# Patient Record
Sex: Male | Born: 1955
Health system: Southern US, Community
[De-identification: ages and names within clinical notes are randomized; demographics above are authoritative.]

## PROBLEM LIST (undated history)

## (undated) DIAGNOSIS — F419 Anxiety disorder, unspecified: Secondary | ICD-10-CM

## (undated) DIAGNOSIS — I10 Essential (primary) hypertension: Secondary | ICD-10-CM

## (undated) DIAGNOSIS — F32A Depression, unspecified: Secondary | ICD-10-CM

## (undated) DIAGNOSIS — F329 Major depressive disorder, single episode, unspecified: Secondary | ICD-10-CM

## (undated) DIAGNOSIS — N200 Calculus of kidney: Secondary | ICD-10-CM

## (undated) DIAGNOSIS — K5792 Diverticulitis of intestine, part unspecified, without perforation or abscess without bleeding: Secondary | ICD-10-CM

## (undated) HISTORY — PX: CYST REMOVAL TRUNK: SHX6283

## (undated) HISTORY — DX: Essential (primary) hypertension: I10

---

## 2002-10-05 HISTORY — PX: NECK SURGERY: SHX720

## 2003-07-08 ENCOUNTER — Emergency Department (HOSPITAL_COMMUNITY): Admission: EM | Admit: 2003-07-08 | Discharge: 2003-07-08 | Payer: Self-pay | Admitting: Emergency Medicine

## 2003-09-03 ENCOUNTER — Ambulatory Visit (HOSPITAL_COMMUNITY): Admission: RE | Admit: 2003-09-03 | Discharge: 2003-09-04 | Payer: Self-pay | Admitting: Neurosurgery

## 2004-05-02 ENCOUNTER — Ambulatory Visit (HOSPITAL_COMMUNITY): Admission: RE | Admit: 2004-05-02 | Discharge: 2004-05-02 | Payer: Self-pay | Admitting: Neurosurgery

## 2004-05-09 ENCOUNTER — Ambulatory Visit (HOSPITAL_COMMUNITY): Admission: RE | Admit: 2004-05-09 | Discharge: 2004-05-09 | Payer: Self-pay | Admitting: Neurosurgery

## 2004-10-11 ENCOUNTER — Emergency Department (HOSPITAL_COMMUNITY): Admission: EM | Admit: 2004-10-11 | Discharge: 2004-10-11 | Payer: Self-pay | Admitting: Emergency Medicine

## 2004-10-16 ENCOUNTER — Ambulatory Visit (HOSPITAL_COMMUNITY): Admission: RE | Admit: 2004-10-16 | Discharge: 2004-10-16 | Payer: Self-pay | Admitting: Urology

## 2005-06-20 ENCOUNTER — Emergency Department (HOSPITAL_COMMUNITY): Admission: EM | Admit: 2005-06-20 | Discharge: 2005-06-20 | Payer: Self-pay | Admitting: Emergency Medicine

## 2009-09-25 ENCOUNTER — Emergency Department (HOSPITAL_COMMUNITY): Admission: EM | Admit: 2009-09-25 | Discharge: 2009-09-25 | Payer: Self-pay | Admitting: Emergency Medicine

## 2011-01-05 LAB — URINE CULTURE: Colony Count: 2000

## 2011-01-05 LAB — URINALYSIS, ROUTINE W REFLEX MICROSCOPIC
Bilirubin Urine: NEGATIVE
Glucose, UA: NEGATIVE mg/dL
Ketones, ur: NEGATIVE mg/dL
Nitrite: NEGATIVE
Protein, ur: 30 mg/dL — AB
Specific Gravity, Urine: 1.023 (ref 1.005–1.030)
Urobilinogen, UA: 0.2 mg/dL (ref 0.0–1.0)
pH: 6 (ref 5.0–8.0)

## 2011-01-05 LAB — BASIC METABOLIC PANEL
BUN: 16 mg/dL (ref 6–23)
CO2: 21 mEq/L (ref 19–32)
Calcium: 9.3 mg/dL (ref 8.4–10.5)
Chloride: 107 mEq/L (ref 96–112)
Creatinine, Ser: 1.47 mg/dL (ref 0.4–1.5)
GFR calc Af Amer: >60 mL/min (ref >60)
GFR calc non Af Amer: 50 mL/min — ABNORMAL LOW (ref >60)
Glucose, Bld: 123 mg/dL — ABNORMAL HIGH (ref 70–99)
Potassium: 3.6 mEq/L (ref 3.5–5.1)
Sodium: 138 mEq/L (ref 135–145)

## 2011-01-05 LAB — URINE MICROSCOPIC-ADD ON

## 2011-02-20 NOTE — Op Note (Signed)
NAME:  Dale Hawkins, Dale Hawkins NO.:  [id number]   MEDICAL RECORD NO.:  [id number]                   PATIENT TYPE:  OIB   LOCATION:  3013                                 FACILITY:  [facility]   PHYSICIAN:  Cristi Loron, M.D.            DATE OF BIRTH:  1955-10-07   DATE OF PROCEDURE:  09/03/2003  DATE OF DISCHARGE:                                 OPERATIVE REPORT   BRIEF HISTORY:  The patient is a 55 year old white male who has suffered  from neck pain.  He failed medical management and was worked up with a  cervical MRI which demonstrated degenerative disk disease and spondylosis,  stenosis at C5-6 and C6-7.  I discussed the various treatment options with  him including surgery.  The patient weighed the risks, benefits, and  alternatives of surgery and decided to proceed with a two-level anterior  cervical diskectomy, fusion and plating.   PREOPERATIVE DIAGNOSES:  C5-6 and C6-7 degenerative disk disease,  spondylosis, stenosis, cervicalgia.   POSTOPERATIVE DIAGNOSES:  C5-6 and C6-7 degenerative disk disease,  spondylosis, stenosis, cervicalgia.   PROCEDURE:  C5-6 and C6-7 extensive anterior cervical  diskectomy/decompression; interbody iliac crest allograft arthrodesis;  anterior cervical plating C5 and C7 with Synthes titanium plate and screws.   SURGEON:  Cristi Loron, M.D.   ASSISTANT:  Danae Orleans. Venetia Maxon, M.D.   ANESTHESIA:  General endotracheal.   ESTIMATED BLOOD LOSS:  100 cc.   SPECIMENS:  None.   DRAINS:  None.   COMPLICATIONS:  None.   DESCRIPTION OF PROCEDURE:  The patient was brought to the operating room by  the anesthesia team and general endotracheal anesthesia was induced.  The  patient remained in the supine position.  A roll was placed under his  shoulders to place his neck in slight extension.  His anterior cervical  region was then prepared with Betadine scrub and Betadine solution.  Sterile  drapes were applied.  I then  injected the area to be incised with Marcaine  with epinephrine solution.  I used a scalpel to make a transverse incision  to the patient's left anterior neck by using the Metzenbaum scissors to  divide the platysma muscle and then dissect medial to his  sternocleidomastoid muscle, jugular vein and carotid artery.  I bluntly  dissected down towards the anterior cervical spine carefully identifying the  esophagus and retracting it medially with the hand-held retractors.  I then  cleared the soft tissue from the anterior cervical spine using Kitner swabs  and then inserted a bent spinal needle at the upper exposed interspace.  Obtained an intraoperative radiograph to confirm our location.   We then used electrocautery to detach the medial border of the longus colli  muscle bilaterally from the C5-6 and C6-7 intervertebral disk space.  We  inserted a Caspar self-retaining retractor for exposure and we then incised  the C5-6 intervertebral disk and performed a partial diskectomy  using the  pituitary forceps and the Carlen curets.  We inserted distractors at C5 and  C6 to distract the near space and then I used a high-speed drill to  decorticate the upper end plates at B2-8 and drill away the remainder of the  C5-6 intervertebral disk and thin out the posterior longitudinal ligament.  We also drilled away some posterior spondylosis with the drill.  We then  incised the thinned out ligament with a __________ knife and then removed it  with a crescent punch undercutting the vertebral end plates decompressing  the thecal sac and then we performed a generous foraminotomy at bilateral C6  nerve roots.   We then repeated this procedure in analogous fashion at the C6-7  decompressing the thecal sac and performing a bilateral C7 foraminotomy.   We now turned our attention to the anterior inner body arthrodesis.  We  obtained iliac crest cortical allograft bone graft and fashioned it to the   approximate dimensions 7 mm in height and 1 cm in depth.  We inserted one  bone graft in the distracted C6-7 interspace.  We removed distraction from 7  and placed it back in C5.  Distracted the C5-6 interspace and then placed a  second bone graft into the C5-6 interspace.  We removed the distraction  screws.  There was a good snug fit of the bone graft at both levels.   We now turned our attention to the anterior spinal instrumentation.  We  obtained the appropriate length Synthes titanium plate.  We drilled off some  ventral spondylosis so that the plate would lay down flat at the C5-6 and C6-  7 intervertebral disk space.  We then laid the plate along the anterior  aspect of the vertebral bodies from C5 to C7.  We drilled two holes in C5,  two at C6, and two at C7.  We then tapped the holes and secured the plate to  the vertebral body and placed two 14 mm screws at C5, 6, and 7.  We then  obtained intraoperative radiograph which demonstrated good position of the  upper plate and screws. (The lower plates and screws looked good in vivo.)  We then secured the screws to the plate by using a locking screw at each  screw.  We then obtained hemostasis with bipolar electrocautery.  We  copiously irrigated the wound with bacitracin solution and removed the  solution and then we removed the Caspar self-retaining retractor.  We then  inspected the esophagus for any damage.  There was none apparent.  We then  reapproximated the base and platysma muscle with interrupted 3-0 Vicryl  suture, subcutaneous tissue with interrupted 3-0 Vicryl suture and the skin  with Steri-Strips and Benzoin.  The wound was then covered with bacitracin  ointment and sterile dressing was applied.  The drapes were removed.  The  patient was subsequently extubated by the anesthesia team and transported to the post anesthesia care unit in stable condition.  All sponge, instrument,  and needle counts were correct at the end  of the case.                                               Cristi Loron, M.D.    JDJ/MEDQ  D:  09/03/2003  T:  09/04/2003  Job:  [id number]

## 2012-04-22 DIAGNOSIS — R519 Headache, unspecified: Secondary | ICD-10-CM | POA: Insufficient documentation

## 2015-02-06 ENCOUNTER — Other Ambulatory Visit: Payer: Self-pay | Admitting: Oncology

## 2015-03-07 ENCOUNTER — Emergency Department (HOSPITAL_COMMUNITY): Payer: 59

## 2015-03-07 ENCOUNTER — Encounter (HOSPITAL_COMMUNITY): Payer: Self-pay

## 2015-03-07 ENCOUNTER — Emergency Department (HOSPITAL_COMMUNITY)
Admission: EM | Admit: 2015-03-07 | Discharge: 2015-03-07 | Disposition: A | Discharge status: Home / Self Care | Payer: 59 | Attending: Emergency Medicine | Admitting: Emergency Medicine

## 2015-03-07 DIAGNOSIS — N201 Calculus of ureter: Secondary | ICD-10-CM | POA: Diagnosis not present

## 2015-03-07 DIAGNOSIS — R109 Unspecified abdominal pain: Secondary | ICD-10-CM | POA: Diagnosis present

## 2015-03-07 LAB — URINALYSIS, ROUTINE W REFLEX MICROSCOPIC
Bilirubin Urine: NEGATIVE
Glucose, UA: NEGATIVE mg/dL
Ketones, ur: NEGATIVE mg/dL
Leukocytes, UA: NEGATIVE
Nitrite: NEGATIVE
Protein, ur: 30 mg/dL — AB
Specific Gravity, Urine: 1.029 (ref 1.005–1.030)
Urobilinogen, UA: 0.2 mg/dL (ref 0.0–1.0)
pH: 5.5 (ref 5.0–8.0)

## 2015-03-07 LAB — BASIC METABOLIC PANEL
Anion gap: 7 (ref 5–15)
BUN: 15 mg/dL (ref 6–20)
CO2: 24 mmol/L (ref 22–32)
Calcium: 8.6 mg/dL — ABNORMAL LOW (ref 8.9–10.3)
Chloride: 104 mmol/L (ref 101–111)
Creatinine, Ser: 1.18 mg/dL (ref 0.61–1.24)
GFR calc Af Amer: >60 mL/min (ref >60)
GFR calc non Af Amer: >60 mL/min (ref >60)
Glucose, Bld: 121 mg/dL — ABNORMAL HIGH (ref 65–99)
Potassium: 3.9 mmol/L (ref 3.5–5.1)
Sodium: 135 mmol/L (ref 135–145)

## 2015-03-07 LAB — CBC
HCT: 41.9 % (ref 39.0–52.0)
Hemoglobin: 14.6 g/dL (ref 13.0–17.0)
MCH: 30.5 pg (ref 26.0–34.0)
MCHC: 34.8 g/dL (ref 30.0–36.0)
MCV: 87.5 fL (ref 78.0–100.0)
Platelets: 193 10*3/uL (ref 150–400)
RBC: 4.79 MIL/uL (ref 4.22–5.81)
RDW: 13.2 % (ref 11.5–15.5)
WBC: 10 10*3/uL (ref 4.0–10.5)

## 2015-03-07 LAB — URINE MICROSCOPIC-ADD ON

## 2015-03-07 MED ORDER — ONDANSETRON 4 MG PO TBDP: 4.0000 mg | ORAL_TABLET | Freq: Three times a day (TID) | ORAL | Status: DC | PRN

## 2015-03-07 MED ORDER — ONDANSETRON HCL 4 MG/2ML IJ SOLN
4.0000 mg | Freq: Once | INTRAMUSCULAR | Status: AC
  Administered 2015-03-07: 4 mg via INTRAVENOUS
  Filled 2015-03-07: qty 2

## 2015-03-07 MED ORDER — TAMSULOSIN HCL 0.4 MG PO CAPS: 0.4000 mg | ORAL_CAPSULE | Freq: Every day | ORAL | Status: DC

## 2015-03-07 MED ORDER — KETOROLAC TROMETHAMINE 30 MG/ML IJ SOLN
30.0000 mg | Freq: Once | INTRAMUSCULAR | Status: AC
Start: 2015-03-07 — End: 2015-03-07
  Administered 2015-03-07: 30 mg via INTRAVENOUS
  Filled 2015-03-07: qty 1

## 2015-03-07 MED ORDER — MORPHINE SULFATE 4 MG/ML IJ SOLN
4.0000 mg | Freq: Once | INTRAMUSCULAR | Status: AC
  Administered 2015-03-07: 4 mg via INTRAVENOUS
  Filled 2015-03-07: qty 1

## 2015-03-07 MED ORDER — OXYCODONE-ACETAMINOPHEN 7.5-325 MG PO TABS: 2.0000 | ORAL_TABLET | Freq: Four times a day (QID) | ORAL | Status: DC | PRN

## 2015-03-07 NOTE — ED Notes (Signed)
PA at bedside.

## 2015-03-07 NOTE — Discharge Instructions (Signed)
Please follow up with Dr. Vernie Ammonsttelin to schedule a follow up appointment. Please take pain medication and/or muscle relaxants as prescribed and as needed for pain. Please do not drive on narcotic pain medication or on muscle relaxants. Please read all discharge instructions and return precautions.   Kidney Stones Kidney stones (urolithiasis) are deposits that form inside your kidneys. The intense pain is caused by the stone moving through the urinary tract. When the stone moves, the ureter goes into spasm around the stone. The stone is usually passed in the urine.  CAUSES   A disorder that makes certain neck glands produce too much parathyroid hormone (primary hyperparathyroidism).  A buildup of uric acid crystals, similar to gout in your joints.  Narrowing (stricture) of the ureter.  A kidney obstruction present at birth (congenital obstruction).  Previous surgery on the kidney or ureters.  Numerous kidney infections. SYMPTOMS   Feeling sick to your stomach (nauseous).  Throwing up (vomiting).  Blood in the urine (hematuria).  Pain that usually spreads (radiates) to the groin.  Frequency or urgency of urination. DIAGNOSIS   Taking a history and physical exam.  Blood or urine tests.  CT scan.  Occasionally, an examination of the inside of the urinary bladder (cystoscopy) is performed. TREATMENT   Observation.  Increasing your fluid intake.  Extracorporeal shock wave lithotripsy--This is a noninvasive procedure that uses shock waves to break up kidney stones.  Surgery may be needed if you have severe pain or persistent obstruction. There are various surgical procedures. Most of the procedures are performed with the use of small instruments. Only small incisions are needed to accommodate these instruments, so recovery time is minimized. The size, location, and chemical composition are all important variables that will determine the proper choice of action for you. Talk to  your health care provider to better understand your situation so that you will minimize the risk of injury to yourself and your kidney.  HOME CARE INSTRUCTIONS   Drink enough water and fluids to keep your urine clear or pale yellow. This will help you to pass the stone or stone fragments.  Strain all urine through the provided strainer. Keep all particulate matter and stones for your health care provider to see. The stone causing the pain may be as small as a grain of salt. It is very important to use the strainer each and every time you pass your urine. The collection of your stone will allow your health care provider to analyze it and verify that a stone has actually passed. The stone analysis will often identify what you can do to reduce the incidence of recurrences.  Only take over-the-counter or prescription medicines for pain, discomfort, or fever as directed by your health care provider.  Make a follow-up appointment with your health care provider as directed.  Get follow-up X-rays if required. The absence of pain does not always mean that the stone has passed. It may have only stopped moving. If the urine remains completely obstructed, it can cause loss of kidney function or even complete destruction of the kidney. It is your responsibility to make sure X-rays and follow-ups are completed. Ultrasounds of the kidney can show blockages and the status of the kidney. Ultrasounds are not associated with any radiation and can be performed easily in a matter of minutes. SEEK MEDICAL CARE IF:  You experience pain that is progressive and unresponsive to any pain medicine you have been prescribed. SEEK IMMEDIATE MEDICAL CARE IF:   Pain cannot  be controlled with the prescribed medicine.  You have a fever or shaking chills.  The severity or intensity of pain increases over 18 hours and is not relieved by pain medicine.  You develop a new onset of abdominal pain.  You feel faint or pass  out.  You are unable to urinate. MAKE SURE YOU:   Understand these instructions.  Will watch your condition.  Will get help right away if you are not doing well or get worse. Document Released: 09/21/2005 Document Revised: 05/24/2013 Document Reviewed: 02/22/2013 [facility] Patient Information 2015 [place], [place]. This information is not intended to replace advice given to you by your health care provider. Make sure you discuss any questions you have with your health care provider.

## 2015-03-07 NOTE — ED Provider Notes (Signed)
CSN: [id number]     Arrival date & time 03/07/15  0048 History   First MD Initiated Contact with Patient 03/07/15 0118     Chief Complaint  Patient presents with  . Flank Pain     (Consider location/radiation/quality/duration/timing/severity/associated sxs/prior Treatment) HPI Comments: Patient is a 59 year old male past medical history significant for nephrolithiasis presenting to the emergency department for acute onset left flank pain that is now radiating to his left lower abdomen. He states his pain began around 9 PM last evening. Describes it as waxing and waning in nature. Endorses 3 episodes of nonbloody nonbilious emesis with increased pain. He tried his at home oxycodone without relief. No modifying factors identified. He does endorse a history of lithotripsy, several years ago. He is followed by Dr. Vernie Ammons at [facility]. No abdominal surgical history.  Patient is a 59 y.o. male presenting with flank pain.  Flank Pain Associated symptoms include abdominal pain, nausea and vomiting.    History reviewed. No pertinent past medical history. History reviewed. No pertinent past surgical history. History reviewed. No pertinent family history. History  Substance Use Topics  . Smoking status: Never Smoker   . Smokeless tobacco: Not on file  . Alcohol Use: No    Review of Systems  Gastrointestinal: Positive for nausea, vomiting and abdominal pain. Negative for diarrhea and constipation.  Genitourinary: Positive for flank pain.  All other systems reviewed and are negative.     Allergies  Review of patient's allergies indicates no known allergies.  Home Medications   Prior to Admission medications   Medication Sig Start Date End Date Taking? Authorizing Provider  ibuprofen (ADVIL,MOTRIN) 200 MG tablet Take 400 mg by mouth every 6 (six) hours as needed for mild pain.   Yes Historical Provider, MD  Misc Natural Products (TART CHERRY ADVANCED) CAPS Take 2 capsules by mouth  at bedtime.   Yes Historical Provider, MD  PRESCRIPTION MEDICATION Apply 1 application topically daily.   Yes Historical Provider, MD  ondansetron (ZOFRAN ODT) 4 MG disintegrating tablet Take 1 tablet (4 mg total) by mouth every 8 (eight) hours as needed for nausea. 03/07/15   Kerah Hardebeck, PA-C  oxyCODONE-acetaminophen (PERCOCET) 7.5-325 MG per tablet Take 2 tablets by mouth every 6 (six) hours as needed for severe pain. 03/07/15   Zoey Bidwell, PA-C  tamsulosin (FLOMAX) 0.4 MG CAPS capsule Take 1 capsule (0.4 mg total) by mouth daily. 03/07/15   Hermenegildo Clausen, PA-C   BP 130/86 mmHg  Pulse 57  Temp(Src) 98 F (36.7 C) (Oral)  Resp 18  Ht  (1.803 m)  Wt 185 lb (83.915 kg)  BMI 25.81 kg/m2  SpO2 98% Physical Exam  Constitutional: He is oriented to person, place, and time. He appears well-developed and well-nourished. No distress.  HENT:  Head: Normocephalic and atraumatic.  Right Ear: External ear normal.  Left Ear: External ear normal.  Nose: Nose normal.  Eyes: Conjunctivae are normal.  Neck: Neck supple.  Cardiovascular: Normal rate, regular rhythm and normal heart sounds.   Pulmonary/Chest: Effort normal and breath sounds normal.  Abdominal: Soft. Bowel sounds are normal. He exhibits no distension. There is tenderness. There is no rigidity, no rebound, no guarding and no CVA tenderness.  Neurological: He is alert and oriented to person, place, and time.  Skin: Skin is warm and dry. He is not diaphoretic.  Nursing note and vitals reviewed.   ED Course  Procedures (including critical care time) Medications  morphine 4 MG/ML injection 4 mg (  4 mg Intravenous Given 03/07/15 0221)  ondansetron (ZOFRAN) injection 4 mg (4 mg Intravenous Given 03/07/15 0220)  ketorolac (TORADOL) 30 MG/ML injection 30 mg (30 mg Intravenous Given 03/07/15 0406)    Labs Review Labs Reviewed  BASIC METABOLIC PANEL - Abnormal; Notable for the following:    Glucose, Bld 121 (*)     Calcium 8.6 (*)    All other components within normal limits  URINALYSIS, ROUTINE W REFLEX MICROSCOPIC (NOT AT [facility]) - Abnormal; Notable for the following:    Color, Urine AMBER (*)    APPearance CLOUDY (*)    Hgb urine dipstick LARGE (*)    Protein, ur 30 (*)    All other components within normal limits  URINE MICROSCOPIC-ADD ON - Abnormal; Notable for the following:    Crystals CA OXALATE CRYSTALS (*)    All other components within normal limits  CBC    Imaging Review Ct Renal Stone Study  03/07/2015   CLINICAL DATA:  LEFT flank pain and hematuria beginning at 2100 hours yesterday, not improved with prescription pain medication. History of kidney stones.  EXAM: CT ABDOMEN AND PELVIS WITHOUT CONTRAST  TECHNIQUE: Multidetector CT imaging of the abdomen and pelvis was performed following the standard protocol without IV contrast.  COMPARISON:  Abdominal radiograph September 25, 2009 and CT abdomen and pelvis June 20, 2005  FINDINGS: LUNG BASES: Included view of the lung bases demonstrate dependent atelectasis. The visualized heart and pericardium are unremarkable. Mild pectus excavatum.  KIDNEYS/BLADDER: Kidneys are orthotopic, demonstrating normal size and morphology. At least 8 RIGHT and 3 LEFT nephrolithiasis measuring up to 4 mm on the RIGHT, 4 mm on the LEFT. Mild LEFT hydroureteronephrosis to the level of the distal ureter where a 3 mm calculus is seen. In addition, 4 mm calculus at RIGHT ureterovesicular junction. No hydronephrosis; limited assessment for renal masses on this nonenhanced examination. The unopacified ureters are normal in course and caliber. Urinary bladder is partially distended and unremarkable.  SOLID ORGANS: The liver, spleen, gallbladder, pancreas and adrenal glands are unremarkable for this non-contrast examination.  GASTROINTESTINAL TRACT: Small hiatal hernia. The stomach, small and large bowel are normal in course and caliber without inflammatory changes, the  sensitivity may be decreased by lack of enteric contrast. Moderate colonic diverticulosis. Superimposed focal inflammation at the level the sigmoid colon consistent with acute diverticulitis, axial 76/87. Normal appendix.  PERITONEUM/RETROPERITONEUM: Aortoiliac vessels are normal in course and caliber. No lymphadenopathy by CT size criteria. Mild prostatomegaly. No intraperitoneal free fluid nor free air.  SOFT TISSUES/ OSSEOUS STRUCTURES: Nonsuspicious. Small fat containing inguinal hernias.  IMPRESSION: Mild LEFT hydroureteronephrosis to the level of the distal ureter where a 3 mm calculus is seen. In addition, nonobstructing 4 mm RIGHT ureteral vesicular junction calculus.  Mild uncomplicated acute sigmoid diverticulitis.  Bilateral nephrolithiasis measuring up to 4 mm.   Electronically Signed   By: Awilda Metro M.D.   On: 03/07/2015 03:12     EKG Interpretation None      MDM   Final diagnoses:  Flank pain  Right ureteral stone  Left ureteral stone    Filed Vitals:   03/07/15 0403  BP: 130/86  Pulse: 57  Temp: 98 F (36.7 C)  Resp: 18   Afebrile, NAD, non-toxic appearing, AAOx4.  I have reviewed nursing notes, vital signs, and all appropriate lab and imaging results if ordered as above.  Pt has been diagnosed with a Kidney Stone via CT. There is no evidence of significant hydronephrosis, serum  creatine WNL, vitals sign stable and the pt does not have irratractable vomiting. Pt will be dc home with pain medications & has been advised to follow up with Dr. Vernie Ammonsttelin his urologist. Return precautions discussed. Patient is agreeable to plan. Patient is stable at time of discharge     Francee PiccoloJennifer Divit Stipp, PA-C 03/07/15 0447  Layla MawKristen N Ward, DO 03/07/15 [id number]

## 2015-03-07 NOTE — ED Notes (Signed)
Patient followed by Dr Irene Papttolin [facility]), hx of kidney stones. Patient states his pain started at 2100, took 2 Oxycodone without relief. Patient states he is having left flank pain, similar to his kidney stone pain in the past.

## 2015-03-07 NOTE — ED Notes (Signed)
Pt complains of left flank pain since 9pm, hx of kidney stones and the pain is the same, pt has vomited two times tonight

## 2015-03-07 NOTE — ED Notes (Signed)
Family at bedside. 

## 2015-05-10 ENCOUNTER — Other Ambulatory Visit: Payer: Self-pay | Admitting: *Deleted

## 2015-05-15 ENCOUNTER — Encounter (HOSPITAL_COMMUNITY): Payer: Self-pay | Admitting: Emergency Medicine

## 2015-05-15 ENCOUNTER — Emergency Department (HOSPITAL_COMMUNITY)
Admission: EM | Admit: 2015-05-15 | Discharge: 2015-05-15 | Disposition: A | Discharge status: Home / Self Care | Payer: 59 | Attending: Emergency Medicine | Admitting: Emergency Medicine

## 2015-05-15 DIAGNOSIS — N200 Calculus of kidney: Secondary | ICD-10-CM | POA: Insufficient documentation

## 2015-05-15 DIAGNOSIS — R109 Unspecified abdominal pain: Secondary | ICD-10-CM | POA: Diagnosis present

## 2015-05-15 DIAGNOSIS — Z9889 Other specified postprocedural states: Secondary | ICD-10-CM | POA: Insufficient documentation

## 2015-05-15 DIAGNOSIS — Z79899 Other long term (current) drug therapy: Secondary | ICD-10-CM | POA: Insufficient documentation

## 2015-05-15 LAB — CBC
HCT: 43.7 % (ref 39.0–52.0)
HEMOGLOBIN: 15.1 g/dL (ref 13.0–17.0)
MCH: 30.1 pg (ref 26.0–34.0)
MCHC: 34.6 g/dL (ref 30.0–36.0)
MCV: 87.2 fL (ref 78.0–100.0)
PLATELETS: 226 10*3/uL (ref 150–400)
RBC: 5.01 MIL/uL (ref 4.22–5.81)
RDW: 13.4 % (ref 11.5–15.5)
WBC: 7.3 10*3/uL (ref 4.0–10.5)

## 2015-05-15 LAB — BASIC METABOLIC PANEL
Anion gap: 10 (ref 5–15)
BUN: 17 mg/dL (ref 6–20)
CALCIUM: 9.3 mg/dL (ref 8.9–10.3)
CO2: 26 mmol/L (ref 22–32)
CREATININE: 1.33 mg/dL — AB (ref 0.61–1.24)
Chloride: 103 mmol/L (ref 101–111)
GFR calc Af Amer: >60 mL/min (ref >60)
GFR, EST NON AFRICAN AMERICAN: 57 mL/min — AB (ref >60)
GLUCOSE: 113 mg/dL — AB (ref 65–99)
Potassium: 4.2 mmol/L (ref 3.5–5.1)
Sodium: 139 mmol/L (ref 135–145)

## 2015-05-15 LAB — URINALYSIS, ROUTINE W REFLEX MICROSCOPIC
BILIRUBIN URINE: NEGATIVE
Glucose, UA: NEGATIVE mg/dL
KETONES UR: NEGATIVE mg/dL
LEUKOCYTES UA: NEGATIVE
NITRITE: NEGATIVE
PROTEIN: NEGATIVE mg/dL
Specific Gravity, Urine: 1.017 (ref 1.005–1.030)
UROBILINOGEN UA: 0.2 mg/dL (ref 0.0–1.0)
pH: 5.5 (ref 5.0–8.0)

## 2015-05-15 LAB — URINE MICROSCOPIC-ADD ON

## 2015-05-15 MED ORDER — ONDANSETRON HCL 4 MG PO TABS: 4.0000 mg | ORAL_TABLET | Freq: Four times a day (QID) | ORAL | Status: DC

## 2015-05-15 MED ORDER — ONDANSETRON HCL 4 MG/2ML IJ SOLN
4.0000 mg | Freq: Once | INTRAMUSCULAR | Status: AC
  Administered 2015-05-15: 4 mg via INTRAVENOUS
  Filled 2015-05-15: qty 2

## 2015-05-15 MED ORDER — SODIUM CHLORIDE 0.9 % IV BOLUS (SEPSIS)
1000.0000 mL | Freq: Once | INTRAVENOUS | Status: AC
  Administered 2015-05-15: 1000 mL via INTRAVENOUS

## 2015-05-15 MED ORDER — TAMSULOSIN HCL 0.4 MG PO CAPS: 0.4000 mg | ORAL_CAPSULE | Freq: Every day | ORAL | Status: DC

## 2015-05-15 MED ORDER — IBUPROFEN 600 MG PO TABS: 600.0000 mg | ORAL_TABLET | Freq: Four times a day (QID) | ORAL | Status: DC | PRN

## 2015-05-15 MED ORDER — ACETAMINOPHEN 325 MG PO TABS
650.0000 mg | ORAL_TABLET | Freq: Once | ORAL | Status: DC
  Filled 2015-05-15: qty 2

## 2015-05-15 MED ORDER — KETOROLAC TROMETHAMINE 30 MG/ML IJ SOLN
30.0000 mg | Freq: Once | INTRAMUSCULAR | Status: DC
  Filled 2015-05-15: qty 1

## 2015-05-15 MED ORDER — OXYCODONE-ACETAMINOPHEN 5-325 MG PO TABS: 1.0000 | ORAL_TABLET | ORAL | Status: DC | PRN

## 2015-05-15 NOTE — Discharge Instructions (Signed)
Return without fail for worsening symptoms, including vomiting unable to keep down food or fluids, worsening pain, fevers, pain or burning with urination, or any other symptoms concerning to you. Please call urologist for follow-up within 1 week.   Kidney Stones Kidney stones (urolithiasis) are solid masses that form inside your kidneys. The intense pain is caused by the stone moving through the kidney, ureter, bladder, and urethra (urinary tract). When the stone moves, the ureter starts to spasm around the stone. The stone is usually passed in your pee (urine).  HOME CARE  Drink enough fluids to keep your pee clear or pale yellow. This helps to get the stone out.  Strain all pee through the provided strainer. Do not pee without peeing through the strainer, not even once. If you pee the stone out, catch it in the strainer. The stone may be as small as a grain of salt. Take this to your doctor. This will help your doctor figure out what you can do to try to prevent more kidney stones.  Only take medicine as told by your doctor.  Follow up with your doctor as told.  Get follow-up X-rays as told by your doctor. GET HELP IF: You have pain that gets worse even if you have been taking pain medicine. GET HELP RIGHT AWAY IF:   Your pain does not get better with medicine.  You have a fever or shaking chills.  Your pain increases and gets worse over 18 hours.  You have new belly (abdominal) pain.  You feel faint or pass out.  You are unable to pee. MAKE SURE YOU:   Understand these instructions.  Will watch your condition.  Will get help right away if you are not doing well or get worse. Document Released: 03/09/2008 Document Revised: 05/24/2013 Document Reviewed: 02/22/2013 [facility] Patient Information 2015 [place], [place]. This information is not intended to replace advice given to you by your health care provider. Make sure you discuss any questions you have with your health care  provider.

## 2015-05-15 NOTE — ED Notes (Signed)
Pt states yesterday began having rt sided flank pain radiating into groin area beginning yesterday. States it eased up last night, but has worsened into today. Attempted to get into PCP office today. States pain has become unbearable. Hx of kidney stones, states it feels like a kidney stone. Denies blood in urine/difficulty urinating, N/V/D, fever.

## 2015-05-15 NOTE — ED Provider Notes (Signed)
CSN: [id number]     Arrival date & time 05/15/15  1208 History   First MD Initiated Contact with Patient 05/15/15 1238     Chief Complaint  Patient presents with  . Flank Pain     (Consider location/radiation/quality/duration/timing/severity/associated sxs/prior Treatment) HPI 59 year old male who presents with right flank pain. He has a history of nephrolithiasis. Reports onset of right upper quadrant pain yesterday, that is now radiating into his right flank. Pain has been intermittent and improved with home analgesics. Reports that pain is very similar to prior history of kidney stones. Denies any associated dysuria, hematuria, urinary frequency, nausea, vomiting, diarrhea, melena, or hematochezia. He denies that pain is postprandial in nature. He has been straining his urine at home, and states that he has not been able to pass his kidney stone yet. He had a severe episode of pain just prior to arrival which she took oxycodone. He subsequently drove himself to the ED from work for evaluation. On arrival, patient had fully subsided. He reports mild nausea.    History reviewed. No pertinent past medical history. Past Surgical History  Procedure Laterality Date  . Cyst removal trunk     History reviewed. No pertinent family history. Social History  Substance Use Topics  . Smoking status: Never Smoker   . Smokeless tobacco: None  . Alcohol Use: No    Review of Systems 10/14 systems reviewed and are negative other than those stated in the HPI  Allergies  Review of patient's allergies indicates no known allergies.  Home Medications   Prior to Admission medications   Medication Sig Start Date End Date Taking? Authorizing Provider  ibuprofen (ADVIL,MOTRIN) 200 MG tablet Take 400 mg by mouth every 6 (six) hours as needed for mild pain.   Yes Historical Provider, MD  oxyCODONE-acetaminophen (PERCOCET) 7.5-325 MG per tablet Take 2 tablets by mouth every 6 (six) hours as needed for  severe pain. 03/07/15  Yes Jennifer Piepenbrink, PA-C  PRESCRIPTION MEDICATION Apply 1 application topically daily.   Yes Historical Provider, MD  tamsulosin (FLOMAX) 0.4 MG CAPS capsule Take 1 capsule (0.4 mg total) by mouth daily. 03/07/15  Yes Jennifer Piepenbrink, PA-C  ibuprofen (ADVIL,MOTRIN) 600 MG tablet Take 1 tablet (600 mg total) by mouth every 6 (six) hours as needed for moderate pain. 05/15/15   Lavera Guise, MD  ondansetron (ZOFRAN ODT) 4 MG disintegrating tablet Take 1 tablet (4 mg total) by mouth every 8 (eight) hours as needed for nausea. Patient not taking: Reported on 05/15/2015 03/07/15   Francee Piccolo, PA-C  ondansetron (ZOFRAN) 4 MG tablet Take 1 tablet (4 mg total) by mouth every 6 (six) hours. 05/15/15   Lavera Guise, MD  oxyCODONE-acetaminophen (PERCOCET/ROXICET) 5-325 MG per tablet Take 1-2 tablets by mouth every 4 (four) hours as needed for severe pain. 05/15/15   Lavera Guise, MD  tamsulosin (FLOMAX) 0.4 MG CAPS capsule Take 1 capsule (0.4 mg total) by mouth daily. 05/15/15   Lavera Guise, MD   BP 139/89 mmHg  Pulse 61  Temp(Src) 97.8 F (36.6 C) (Oral)  Resp 20  SpO2 97% Physical Exam Physical Exam  Nursing note and vitals reviewed. Constitutional: Well developed, well nourished, non-toxic, and in no acute distress Head: Normocephalic and atraumatic.  Mouth/Throat: Oropharynx is clear and moist.  Neck: Normal range of motion. Neck supple.  Cardiovascular: Normal rate and regular rhythm.   no lower extremity edema. Pulmonary/Chest: Effort normal and breath sounds normal.  Abdominal: Soft. There is  no tenderness. There is no rebound and no guarding.  no CVA tenderness.  Musculoskeletal: Normal range of motion.  Neurological: Alert, no facial droop, fluent speech, moves all extremities symmetrically Skin: Skin is warm and dry.  Psychiatric: Cooperative  ED Course  Procedures (including critical care time) Labs Review Labs Reviewed  URINALYSIS, ROUTINE W REFLEX  MICROSCOPIC (NOT AT [facility]) - Abnormal; Notable for the following:    Hgb urine dipstick MODERATE (*)    All other components within normal limits  BASIC METABOLIC PANEL - Abnormal; Notable for the following:    Glucose, Bld 113 (*)    Creatinine, Ser 1.33 (*)    GFR calc non Af Amer 57 (*)    All other components within normal limits  CBC  URINE MICROSCOPIC-ADD ON    Imaging Review No results found.   EKG Interpretation None      MDM   Final diagnoses:  Kidney stone   In short, this is a 59 year old male with history of nephrolithiasis who presents with right flank pain consistent with prior episodes of urolithiasis. He is nontoxic in no acute distress on presentation. His vital signs are within normal limits. He is a soft and benign abdomen without any abdominal pain or significant CVA tenderness. He did have one episode of vomiting while here in the emergency department. Basic blood work reveals normal CBC and BMP with normal renal function. During ED course, patient did have episode of nausea and vomiting. He is given IV fluids as well as Zofran for symptomatic control. UA shows evidence of moderate amount of blood, that seems consistent with history of nephrolithiasis. Given that these are the exact same symptoms he has had with previous history of kidney stones, do not think that he requires any additional imaging at this time to confirm. He does not have any major risk factors for other serious or toxic etiologies of his symptoms such as aortic pathology or bleeding. He has previous CT scan 1 month ago, with no evidence of aortic aneurysm. He is able to tolerate oral intake in the emergency department without any issues. He is given urology follow-up as an outpatient. Strict return instructions were also reviewed with this patient as well as symptoms that would be suggestive of infected kidney stone or a stone that is unlikely to pass. He expressed understanding of all discharge  instructions for comfortable to plan of care.  Lavera Guise, MD 05/15/15 [phone]

## 2015-09-09 ENCOUNTER — Telehealth: Payer: Self-pay | Admitting: *Deleted

## 2015-09-09 NOTE — Telephone Encounter (Signed)
Called patient to inform him a prescription has been sent to pharmacy for him to take prior to chemo treatment.

## 2016-07-01 ENCOUNTER — Emergency Department (HOSPITAL_COMMUNITY)
Admission: EM | Admit: 2016-07-01 | Discharge: 2016-07-01 | Disposition: A | Discharge status: Home / Self Care | Payer: 59 | Attending: Emergency Medicine | Admitting: Emergency Medicine

## 2016-07-01 ENCOUNTER — Encounter (HOSPITAL_COMMUNITY): Payer: Self-pay

## 2016-07-01 DIAGNOSIS — Y929 Unspecified place or not applicable: Secondary | ICD-10-CM | POA: Insufficient documentation

## 2016-07-01 DIAGNOSIS — Z79899 Other long term (current) drug therapy: Secondary | ICD-10-CM | POA: Diagnosis not present

## 2016-07-01 DIAGNOSIS — W268XXA Contact with other sharp object(s), not elsewhere classified, initial encounter: Secondary | ICD-10-CM | POA: Diagnosis not present

## 2016-07-01 DIAGNOSIS — S0990XA Unspecified injury of head, initial encounter: Secondary | ICD-10-CM | POA: Diagnosis present

## 2016-07-01 DIAGNOSIS — Y939 Activity, unspecified: Secondary | ICD-10-CM | POA: Diagnosis not present

## 2016-07-01 DIAGNOSIS — S0101XA Laceration without foreign body of scalp, initial encounter: Secondary | ICD-10-CM | POA: Diagnosis not present

## 2016-07-01 DIAGNOSIS — Z791 Long term (current) use of non-steroidal anti-inflammatories (NSAID): Secondary | ICD-10-CM | POA: Diagnosis not present

## 2016-07-01 DIAGNOSIS — Y999 Unspecified external cause status: Secondary | ICD-10-CM | POA: Diagnosis not present

## 2016-07-01 NOTE — Discharge Instructions (Signed)
Keep wound clean with your normal shampoo and water. Keep area covered with a topical antibiotic ointment and bandage (if possible), keep bandage dry, and do not submerge in water for 24 hours. Ice area for additional pain relief and swelling, using an ice pack for no more than 20 minutes per hour of time that passes. Alternate between Ibuprofen and Tylenol for additional pain relief. Follow up with your primary care doctor or the [facility] in approximately 7-10 days for wound recheck and staple removal. Monitor area for signs of infection to include, but not limited to: increasing pain, spreading redness, drainage/pus, worsening swelling, or fevers. Return to emergency department for emergent changing or worsening symptoms.

## 2016-07-01 NOTE — ED Provider Notes (Signed)
[facility]-EMERGENCY DEPT Provider Note   CSN: [id number] Arrival date & time: 07/01/16  2118     History   Chief Complaint Chief Complaint  Patient presents with  . Head Laceration    HPI Dale Hawkins is a 60 y.o. male who presents to the ED with complaints of scalp laceration sustained at 7:15 PM when the tree branch he was cutting down hit him on the scalp/forehead region. Bleeding was controlled fairly quickly and he has not had any further ongoing bleeding, denies blood thinner use or bleeding disorders. Last tetanus was 5 years ago. He denies any pain, headache, vision changes, LOC, chest pain, shortness breath, abdominal pain, nausea, vomiting, neck or back pain, numbness, tingling, focal weakness, or any other complaints or injuries at this time.   The history is provided by the patient and medical records. No language interpreter was used.  Head Laceration  This is a new problem. The current episode started 3 to 5 hours ago. The problem occurs rarely. The problem has not changed since onset.Pertinent negatives include no chest pain, no abdominal pain, no headaches and no shortness of breath. Nothing aggravates the symptoms. Nothing relieves the symptoms. He has tried nothing for the symptoms.    History reviewed. No pertinent past medical history.  There are no active problems to display for this patient.   Past Surgical History:  Procedure Laterality Date  . CYST REMOVAL TRUNK         Home Medications    Prior to Admission medications   Medication Sig Start Date End Date Taking? Authorizing Provider  ibuprofen (ADVIL,MOTRIN) 200 MG tablet Take 400 mg by mouth every 6 (six) hours as needed for mild pain.    Historical Provider, MD  ibuprofen (ADVIL,MOTRIN) 600 MG tablet Take 1 tablet (600 mg total) by mouth every 6 (six) hours as needed for moderate pain. 05/15/15   Lavera Guise, MD  ondansetron (ZOFRAN ODT) 4 MG disintegrating tablet Take 1 tablet (4 mg total) by  mouth every 8 (eight) hours as needed for nausea. Patient not taking: Reported on 05/15/2015 03/07/15   Francee Piccolo, PA-C  ondansetron (ZOFRAN) 4 MG tablet Take 1 tablet (4 mg total) by mouth every 6 (six) hours. 05/15/15   Lavera Guise, MD  oxyCODONE-acetaminophen (PERCOCET) 7.5-325 MG per tablet Take 2 tablets by mouth every 6 (six) hours as needed for severe pain. 03/07/15   Francee Piccolo, PA-C  oxyCODONE-acetaminophen (PERCOCET/ROXICET) 5-325 MG per tablet Take 1-2 tablets by mouth every 4 (four) hours as needed for severe pain. 05/15/15   Lavera Guise, MD  PRESCRIPTION MEDICATION Apply 1 application topically daily.    Historical Provider, MD  tamsulosin (FLOMAX) 0.4 MG CAPS capsule Take 1 capsule (0.4 mg total) by mouth daily. 03/07/15   Jennifer Piepenbrink, PA-C  tamsulosin (FLOMAX) 0.4 MG CAPS capsule Take 1 capsule (0.4 mg total) by mouth daily. 05/15/15   Lavera Guise, MD    Family History History reviewed. No pertinent family history.  Social History Social History  Substance Use Topics  . Smoking status: Never Smoker  . Smokeless tobacco: Never Used  . Alcohol use No     Allergies   Review of patient's allergies indicates no known allergies.   Review of Systems Review of Systems  Eyes: Negative for visual disturbance.  Respiratory: Negative for shortness of breath.   Cardiovascular: Negative for chest pain.  Gastrointestinal: Negative for abdominal pain, nausea and vomiting.  Musculoskeletal: Negative for back pain  and neck pain.  Skin: Positive for wound.  Allergic/Immunologic: Negative for immunocompromised state.  Neurological: Negative for syncope, weakness, light-headedness, numbness and headaches.  Hematological: Does not bruise/bleed easily.  Psychiatric/Behavioral: Negative for confusion.   10 Systems reviewed and are negative for acute change except as noted in the HPI.   Physical Exam Updated Vital Signs BP 133/98 (BP Location: Left Arm)    Pulse 69   Temp 98.3 F (36.8 C) (Oral)   Resp 15   Ht 5\' 10"  (1.778 m)   Wt 83.9 kg   SpO2 99%   BMI 26.54 kg/m   Physical Exam  Constitutional: He is oriented to person, place, and time. Vital signs are normal. He appears well-developed and well-nourished.  Non-toxic appearance. No distress.  Afebrile, nontoxic, NAD  HENT:  Head: Normocephalic. Head is with laceration.    Mouth/Throat: Oropharynx is clear and moist and mucous membranes are normal.  ~3cm linear superficial laceration to frontal scalp near the hairline, no ongoing bleeding, no retained FBs noted, no focal bony TTP or scalp crepitus/deformity, no bruising or swelling. SEE PICTURE BELOW  Eyes: Conjunctivae and EOM are normal. Pupils are equal, round, and reactive to light. Right eye exhibits no discharge. Left eye exhibits no discharge.  PERRL, EOMI, no nystagmus, no visual field deficits   Neck: Normal range of motion. Neck supple. No spinous process tenderness and no muscular tenderness present. No neck rigidity. Normal range of motion present.  Cardiovascular: Normal rate and intact distal pulses.   Pulmonary/Chest: Effort normal. No respiratory distress.  Abdominal: Normal appearance. He exhibits no distension.  Musculoskeletal: Normal range of motion.  MAE x4 Strength and sensation grossly intact Distal pulses intact Gait steady  Neurological: He is alert and oriented to person, place, and time. He has normal strength. No cranial nerve deficit or sensory deficit. Coordination and gait normal. GCS eye subscore is 4. GCS verbal subscore is 5. GCS motor subscore is 6.  CN 2-12 grossly intact A&O x4 GCS 15 Sensation and strength intact Gait nonataxic Coordination with finger-to-nose WNL  Skin: Skin is warm and dry. Laceration noted. No rash noted.  Scalp lac as noted above and pictured below  Psychiatric: He has a normal mood and affect.  Nursing note and vitals reviewed.      ED Treatments / Results    Labs (all labs ordered are listed, but only abnormal results are displayed) Labs Reviewed - No data to display  EKG  EKG Interpretation None       Radiology No results found.  Procedures .Marland Kitchen.Laceration Repair Date/Time: 07/01/2016 10:30 PM Performed by: Allen DerryAMPRUBI-SOMS, Kalisha Keadle Authorized by: Allen DerryAMPRUBI-SOMS, Tea Collums   Consent:    Consent obtained:  Verbal   Consent given by:  Patient   Risks discussed:  Pain and infection   Alternatives discussed:  Delayed treatment, no treatment and observation Anesthesia (see MAR for exact dosages):    Anesthesia method:  None Laceration details:    Location:  Scalp   Scalp location:  Frontal   Length (cm):  3 Repair type:    Repair type:  Simple Pre-procedure details:    Preparation:  Patient was prepped and draped in usual sterile fashion Exploration:    Wound exploration: entire depth of wound probed and visualized     Contaminated: no   Treatment:    Area cleansed with:  Saline   Amount of cleaning:  Standard   Irrigation solution:  Sterile saline   Irrigation method:  Syringe Skin repair:  Repair method:  Staples   Number of staples:  3 Approximation:    Approximation:  Close   Vermilion border: well-aligned   Post-procedure details:    Dressing:  Open (no dressing)   Patient tolerance of procedure:  Tolerated well, no immediate complications   (including critical care time)  Medications Ordered in ED Medications - No data to display   Initial Impression / Assessment and Plan / ED Course  I have reviewed the triage vital signs and the nursing notes.  Pertinent labs & imaging results that were available during my care of the patient were reviewed by me and considered in my medical decision making (see chart for details).  Clinical Course    60 y.o. male here with 3cm scalp lac when a tree branch hit his head earlier tonight, no ongoing bleeding, not on blood thinners, no focal neuro deficits and no headache,  per [place] head CT rules he does not need head imaging. Stapled wound closed, discussed wound care and f/up in 7-10 days for staple removal. Doubt need for prophylactic abx. Ice and tylenol/motrin for pain. I explained the diagnosis and have given explicit precautions to return to the ER including for any other new or worsening symptoms. The patient understands and accepts the medical plan as it's been dictated and I have answered their questions. Discharge instructions concerning home care and prescriptions have been given. The patient is STABLE and is discharged to home in good condition.   Final Clinical Impressions(s) / ED Diagnoses   Final diagnoses:  Scalp laceration, initial encounter    New Prescriptions New Prescriptions   No medications on file     Flintstone Camprubi-Soms, PA-C 07/01/16 2238    Benjiman Core, MD 07/02/16 [phone]

## 2016-07-01 NOTE — ED Notes (Signed)
Patient was alert, oriented and stable upon discharge. RN went over AVS and patient had no further questions.  

## 2016-07-01 NOTE — ED Triage Notes (Signed)
Pt was hit in the head with a tree limb ans he has a laceration on front of his head, bleeding controlled at this time

## 2016-07-09 ENCOUNTER — Encounter (HOSPITAL_COMMUNITY): Payer: Self-pay | Admitting: Emergency Medicine

## 2016-07-09 ENCOUNTER — Emergency Department (HOSPITAL_COMMUNITY): Payer: 59

## 2016-07-09 ENCOUNTER — Emergency Department (HOSPITAL_COMMUNITY)
Admission: EM | Admit: 2016-07-09 | Discharge: 2016-07-09 | Disposition: A | Discharge status: Home / Self Care | Payer: 59 | Attending: Emergency Medicine | Admitting: Emergency Medicine

## 2016-07-09 DIAGNOSIS — R109 Unspecified abdominal pain: Secondary | ICD-10-CM | POA: Diagnosis present

## 2016-07-09 DIAGNOSIS — K5732 Diverticulitis of large intestine without perforation or abscess without bleeding: Secondary | ICD-10-CM | POA: Diagnosis not present

## 2016-07-09 DIAGNOSIS — K5792 Diverticulitis of intestine, part unspecified, without perforation or abscess without bleeding: Secondary | ICD-10-CM

## 2016-07-09 DIAGNOSIS — Z79899 Other long term (current) drug therapy: Secondary | ICD-10-CM | POA: Diagnosis not present

## 2016-07-09 HISTORY — DX: Calculus of kidney: N20.0

## 2016-07-09 LAB — CBC WITH DIFFERENTIAL/PLATELET
Basophils Absolute: 0 10*3/uL (ref 0.0–0.1)
Basophils Relative: 0 %
EOS ABS: 0.1 10*3/uL (ref 0.0–0.7)
EOS PCT: 1 %
HCT: 39.7 % (ref 39.0–52.0)
Hemoglobin: 14.5 g/dL (ref 13.0–17.0)
LYMPHS ABS: 1.1 10*3/uL (ref 0.7–4.0)
LYMPHS PCT: 14 %
MCH: 30.9 pg (ref 26.0–34.0)
MCHC: 36.5 g/dL — AB (ref 30.0–36.0)
MCV: 84.5 fL (ref 78.0–100.0)
MONO ABS: 0.6 10*3/uL (ref 0.1–1.0)
Monocytes Relative: 8 %
Neutro Abs: 6.4 10*3/uL (ref 1.7–7.7)
Neutrophils Relative %: 77 %
PLATELETS: 224 10*3/uL (ref 150–400)
RBC: 4.7 MIL/uL (ref 4.22–5.81)
RDW: 13.1 % (ref 11.5–15.5)
WBC: 8.3 10*3/uL (ref 4.0–10.5)

## 2016-07-09 LAB — COMPREHENSIVE METABOLIC PANEL
ALT: 19 U/L (ref 17–63)
ANION GAP: 4 — AB (ref 5–15)
AST: 17 U/L (ref 15–41)
Albumin: 4.2 g/dL (ref 3.5–5.0)
Alkaline Phosphatase: 77 U/L (ref 38–126)
BUN: 19 mg/dL (ref 6–20)
CHLORIDE: 107 mmol/L (ref 101–111)
CO2: 26 mmol/L (ref 22–32)
Calcium: 9 mg/dL (ref 8.9–10.3)
Creatinine, Ser: 1.15 mg/dL (ref 0.61–1.24)
Glucose, Bld: 114 mg/dL — ABNORMAL HIGH (ref 65–99)
POTASSIUM: 4.1 mmol/L (ref 3.5–5.1)
Sodium: 137 mmol/L (ref 135–145)
Total Bilirubin: 1 mg/dL (ref 0.3–1.2)
Total Protein: 7.2 g/dL (ref 6.5–8.1)

## 2016-07-09 LAB — URINALYSIS, ROUTINE W REFLEX MICROSCOPIC
Bilirubin Urine: NEGATIVE
Glucose, UA: NEGATIVE mg/dL
Hgb urine dipstick: NEGATIVE
Ketones, ur: 15 mg/dL — AB
Leukocytes, UA: NEGATIVE
NITRITE: NEGATIVE
PROTEIN: NEGATIVE mg/dL
Specific Gravity, Urine: 1.026 (ref 1.005–1.030)
pH: 6 (ref 5.0–8.0)

## 2016-07-09 LAB — I-STAT CG4 LACTIC ACID, ED: LACTIC ACID, VENOUS: 1.11 mmol/L (ref 0.5–1.9)

## 2016-07-09 MED ORDER — METRONIDAZOLE IN NACL 5-0.79 MG/ML-% IV SOLN
500.0000 mg | Freq: Once | INTRAVENOUS | Status: AC
  Administered 2016-07-09: 500 mg via INTRAVENOUS
  Filled 2016-07-09: qty 100

## 2016-07-09 MED ORDER — OXYCODONE-ACETAMINOPHEN 5-325 MG PO TABS: 1.0000 | ORAL_TABLET | ORAL | 0 refills | Status: DC | PRN

## 2016-07-09 MED ORDER — DEXTROSE 5 % IV SOLN
2.0000 g | Freq: Once | INTRAVENOUS | Status: AC
  Administered 2016-07-09: 2 g via INTRAVENOUS
  Filled 2016-07-09: qty 2

## 2016-07-09 MED ORDER — ONDANSETRON 4 MG PO TBDP: 4.0000 mg | ORAL_TABLET | Freq: Three times a day (TID) | ORAL | 0 refills | Status: DC | PRN

## 2016-07-09 MED ORDER — SODIUM CHLORIDE 0.9 % IV BOLUS (SEPSIS)
1000.0000 mL | Freq: Once | INTRAVENOUS | Status: AC
  Administered 2016-07-09: 1000 mL via INTRAVENOUS

## 2016-07-09 MED ORDER — ONDANSETRON HCL 4 MG/2ML IJ SOLN
4.0000 mg | Freq: Once | INTRAMUSCULAR | Status: AC
  Administered 2016-07-09: 4 mg via INTRAVENOUS
  Filled 2016-07-09: qty 2

## 2016-07-09 MED ORDER — METRONIDAZOLE 500 MG PO TABS: 500.0000 mg | ORAL_TABLET | Freq: Three times a day (TID) | ORAL | 0 refills | Status: AC

## 2016-07-09 MED ORDER — HYDROMORPHONE HCL 1 MG/ML IJ SOLN
1.0000 mg | Freq: Once | INTRAMUSCULAR | Status: AC
Start: 2016-07-09 — End: 2016-07-09
  Administered 2016-07-09: 1 mg via INTRAVENOUS
  Filled 2016-07-09: qty 1

## 2016-07-09 MED ORDER — CIPROFLOXACIN HCL 500 MG PO TABS: 500.0000 mg | ORAL_TABLET | Freq: Two times a day (BID) | ORAL | 0 refills | Status: AC

## 2016-07-09 NOTE — ED Notes (Signed)
Patient transported to CT 

## 2016-07-09 NOTE — ED Triage Notes (Signed)
Pt reports R flank pain for past 2 days. Hx of kidney stones, feels like same.

## 2016-07-09 NOTE — ED Notes (Signed)
Urinal at bedside, patient family aware that we need urine.

## 2016-07-09 NOTE — ED Notes (Signed)
Per Dr Webb Silversmitheglar no need to run 2nd [place]

## 2016-07-09 NOTE — ED Notes (Addendum)
Patient returned from CT.  Now having nausea and vomiting.  Pain only decreased by one point.  MD notified.
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
## 2016-07-09 NOTE — ED Notes (Signed)
Patient requesting something to eat.  MD notified.  Starting with PO fluids first then advancing.

## 2016-07-10 LAB — URINE CULTURE: Culture: NO GROWTH

## 2016-08-29 ENCOUNTER — Encounter (HOSPITAL_COMMUNITY): Payer: Self-pay | Admitting: Nurse Practitioner

## 2016-08-29 ENCOUNTER — Emergency Department (HOSPITAL_COMMUNITY)
Admission: EM | Admit: 2016-08-29 | Discharge: 2016-08-29 | Disposition: A | Discharge status: Home / Self Care | Payer: 59 | Attending: Emergency Medicine | Admitting: Emergency Medicine

## 2016-08-29 ENCOUNTER — Emergency Department (HOSPITAL_COMMUNITY): Payer: 59

## 2016-08-29 DIAGNOSIS — R0789 Other chest pain: Secondary | ICD-10-CM | POA: Insufficient documentation

## 2016-08-29 DIAGNOSIS — F419 Anxiety disorder, unspecified: Secondary | ICD-10-CM

## 2016-08-29 HISTORY — DX: Anxiety disorder, unspecified: F41.9

## 2016-08-29 HISTORY — DX: Depression, unspecified: F32.A

## 2016-08-29 HISTORY — DX: Major depressive disorder, single episode, unspecified: F32.9

## 2016-08-29 LAB — CBC
HCT: 45.2 % (ref 39.0–52.0)
HEMOGLOBIN: 15.8 g/dL (ref 13.0–17.0)
MCH: 30.7 pg (ref 26.0–34.0)
MCHC: 35 g/dL (ref 30.0–36.0)
MCV: 87.9 fL (ref 78.0–100.0)
Platelets: 250 10*3/uL (ref 150–400)
RBC: 5.14 MIL/uL (ref 4.22–5.81)
RDW: 13.5 % (ref 11.5–15.5)
WBC: 7.7 10*3/uL (ref 4.0–10.5)

## 2016-08-29 LAB — BASIC METABOLIC PANEL
ANION GAP: 8 (ref 5–15)
BUN: 15 mg/dL (ref 6–20)
CALCIUM: 9.3 mg/dL (ref 8.9–10.3)
CHLORIDE: 105 mmol/L (ref 101–111)
CO2: 25 mmol/L (ref 22–32)
CREATININE: 1.11 mg/dL (ref 0.61–1.24)
GFR calc Af Amer: >60 mL/min (ref >60)
GFR calc non Af Amer: >60 mL/min (ref >60)
GLUCOSE: 104 mg/dL — AB (ref 65–99)
Potassium: 3.8 mmol/L (ref 3.5–5.1)
Sodium: 138 mmol/L (ref 135–145)

## 2016-08-29 LAB — I-STAT TROPONIN, ED
TROPONIN I, POC: 0 ng/mL (ref 0.00–0.08)
Troponin i, poc: 0 ng/mL (ref 0.00–0.08)

## 2016-08-29 MED ORDER — ASPIRIN 81 MG PO CHEW
324.0000 mg | CHEWABLE_TABLET | Freq: Once | ORAL | Status: AC
  Administered 2016-08-29: 324 mg via ORAL
  Filled 2016-08-29: qty 4

## 2016-08-29 MED ORDER — HYDROXYZINE HCL 25 MG PO TABS: 50.0000 mg | ORAL_TABLET | Freq: Three times a day (TID) | ORAL | 0 refills | Status: DC | PRN

## 2016-08-29 NOTE — ED Provider Notes (Signed)
[facility]ERG[facility]Y DEPT Provider Note   CSN: [id number] Arrival date & time: 08/29/16  0909     History   Chief Complaint Chief Complaint  Patient presents with  . Chest Pain    HPI REG Dale Hawkins is a 60 y.o. male.  HPI 60 year old male with past history of anxiety, depression, and history of kidney stones presents with chest pain. The patient states that he has been under significant increased stress at work over the last several days. Earlier this morning, he awoke with a feeling of anxiety, mild chest pressure, and tingling in his bilateral hands. The tingling has since resolved but he has persistent, mild, dull, aching substernal chest pressure. Denies any associated shortness of breath or vomiting. No nausea. No diaphoresis. He has a history of similar complaints due to anxiety and had a negative stress test in the past. No recent stress test last year. No history of hypertension, hyperlipidemia, family history of coronary disease, patient does not smoke.  Past Medical History:  Diagnosis Date  . Anxiety   . Depression   . Kidney stone     There are no active problems to display for this patient.   Past Surgical History:  Procedure Laterality Date  . CYST REMOVAL TRUNK    . NECK SURGERY  2004       Home Medications    Prior to Admission medications   Medication Sig Start Date End Date Taking? Authorizing Provider  acetaminophen (TYLENOL) 500 MG tablet Take 1,000 mg by mouth daily as needed for pain.   Yes Historical Provider, MD  ALPRAZolam Prudy Feeler) 0.5 MG tablet Take 0.25 mg by mouth 2 (two) times daily as needed for anxiety.   Yes Historical Provider, MD  nystatin (MYCOSTATIN) 100000 UNIT/ML suspension Take 4 mLs by mouth 4 (four) times daily. Use as mouthwash 4 times daily 08/26/16  Yes Historical Provider, MD  PRESCRIPTION MEDICATION Apply 1 application topically daily.   Yes Historical Provider, MD  hydrOXYzine (ATARAX/VISTARIL) 25 MG tablet Take 2 tablets (50  mg total) by mouth every 8 (eight) hours as needed for anxiety (and sleep). 08/29/16   Shaune Pollack, MD  ondansetron (ZOFRAN ODT) 4 MG disintegrating tablet Take 1 tablet (4 mg total) by mouth every 8 (eight) hours as needed for nausea or vomiting. Patient not taking: Reported on 08/29/2016 07/09/16   Canary Brim Tegeler, MD  oxyCODONE-acetaminophen (PERCOCET/ROXICET) 5-325 MG tablet Take 1 tablet by mouth every 4 (four) hours as needed for severe pain. Patient not taking: Reported on 08/29/2016 07/09/16   Heide Scales, MD    Family History History reviewed. No pertinent family history.  Social History Social History  Substance Use Topics  . Smoking status: Never Smoker  . Smokeless tobacco: Never Used  . Alcohol use No     Allergies   Patient has no known allergies.   Review of Systems Review of Systems  Constitutional: Positive for fatigue. Negative for chills and fever.  HENT: Negative for congestion and rhinorrhea.   Eyes: Negative for visual disturbance.  Respiratory: Positive for chest tightness. Negative for cough, shortness of breath and wheezing.   Cardiovascular: Positive for chest pain. Negative for leg swelling.  Gastrointestinal: Negative for abdominal pain, diarrhea, nausea and vomiting.  Genitourinary: Negative for dysuria and flank pain.  Musculoskeletal: Negative for neck pain and neck stiffness.  Skin: Negative for rash and wound.  Allergic/Immunologic: Negative for immunocompromised state.  Neurological: Negative for syncope, weakness and headaches.  Psychiatric/Behavioral: The patient is nervous/anxious.  All other systems reviewed and are negative.    Physical Exam Updated Vital Signs BP 135/87 (BP Location: Right Arm)   Pulse 67   Temp 98.1 F (36.7 C) (Oral)   Resp 18   Ht 5\' 10"  (1.778 m)   Wt 175 lb (79.4 kg)   SpO2 97%   BMI 25.11 kg/m   Physical Exam  Constitutional: He is oriented to person, place, and time. He appears  well-developed and well-nourished. No distress.  HENT:  Head: Normocephalic and atraumatic.  Mouth/Throat: No oropharyngeal exudate.  Eyes: Conjunctivae are normal.  Neck: Neck supple.  Cardiovascular: Normal rate, regular rhythm and normal heart sounds.  Exam reveals no friction rub.   No murmur heard. Pulmonary/Chest: Effort normal and breath sounds normal. No respiratory distress. He has no wheezes. He has no rales.  Abdominal: He exhibits no distension.  Musculoskeletal: He exhibits no edema.  Neurological: He is alert and oriented to person, place, and time. He exhibits normal muscle tone.  Skin: Skin is warm. Capillary refill takes less than 2 seconds.  Psychiatric: He has a normal mood and affect.  Nursing note and vitals reviewed.    ED Treatments / Results  Labs (all labs ordered are listed, but only abnormal results are displayed) Labs Reviewed  BASIC METABOLIC PANEL - Abnormal; Notable for the following:       Result Value   Glucose, Bld 104 (*)    All other components within normal limits  CBC  I-STAT TROPOININ, ED  I-STAT TROPOININ, ED    EKG  EKG Interpretation  Date/Time:  Saturday August 29 2016 09:16:30 EST Ventricular Rate:  79 PR Interval:    QRS Duration: 101 QT Interval:  373 QTC Calculation: 428 R Axis:   95 Text Interpretation:  Sinus rhythm Probable left atrial enlargement No significant change since last tracing No ST elevations or depressions Confirmed by Erma HeritageISAACS MD, Sheria LangAMERON [phone]([id number]) on 08/29/2016 9:20:40 AM Also confirmed by Erma HeritageISAACS MD, Lacrecia Delval [phone]), editor [place], Cala BradfordKIMBERLY [phone]([id number])  on 08/29/2016 12:22:59 PM       Radiology Dg Chest 2 View  Result Date: 08/29/2016 CLINICAL DATA:  Chest pain EXAM: CHEST  2 VIEW COMPARISON:  None. FINDINGS: The heart size and mediastinal contours are within normal limits. Both lungs are clear. The visualized skeletal structures are unremarkable. IMPRESSION: No active cardiopulmonary disease. Electronically  Signed   By: Alcide CleverMark  Lukens M.D.   On: 08/29/2016 10:12    Procedures Procedures (including critical care time)  Medications Ordered in ED Medications  aspirin chewable tablet 324 mg (324 mg Oral Given 08/29/16 1009)     Initial Impression / Assessment and Plan / ED Course  I have reviewed the triage vital signs and the nursing notes.  Pertinent labs & imaging results that were available during my care of the patient were reviewed by me and considered in my medical decision making (see chart for details).  Clinical Course     60 year old male with past mental history as above who presents with tingling in his bilateral hands and atypical chest pain. Suspect his symptoms are secondary to anxiety versus GERD versus atypical muscle skeletal chest wall pain. He has no coronary risk factors and delta troponin is negative with a heart score less than 3. I've low suspicion for ACS. He has no lower extremity edema, tachycardia, tachypnea, or evidence to suggest DVT or PE. CXR shows no PNA or lung abnormalities. His pain is resolved here. Will discharge home.  Final Clinical Impressions(s) /  ED Diagnoses   Final diagnoses:  Atypical chest pain  Anxiety    New Prescriptions Discharge Medication List as of 08/29/2016 12:38 PM    START taking these medications   Details  hydrOXYzine (ATARAX/VISTARIL) 25 MG tablet Take 2 tablets (50 mg total) by mouth every 8 (eight) hours as needed for anxiety (and sleep)., Starting Sat 08/29/2016, Print         Shaune Pollackameron Laetitia Schnepf, MD 08/29/16 [phone]

## 2016-08-29 NOTE — ED Triage Notes (Signed)
Patient presents to [facility]Ed for complaints of chest pain that started this morning. He describes pain as dull and localizes to left sternal border, 2/10 pain.   Pt has recently been started on Lexapro for anxiety and stopped it cold [place] after 4-5 doses because it made him feel lethargic. Pt has also been treated with several antibiotics recently for kidney stones, head injury, and diverticulitis. Family reports he has lost 12 lbs recently.

## 2016-08-29 NOTE — ED Notes (Signed)
Patient transported to X-ray 

## 2017-01-16 DIAGNOSIS — R05 Cough: Secondary | ICD-10-CM | POA: Diagnosis not present

## 2017-01-16 DIAGNOSIS — J0140 Acute pansinusitis, unspecified: Secondary | ICD-10-CM | POA: Diagnosis not present

## 2017-01-16 DIAGNOSIS — H66002 Acute suppurative otitis media without spontaneous rupture of ear drum, left ear: Secondary | ICD-10-CM | POA: Diagnosis not present

## 2017-02-01 ENCOUNTER — Encounter (HOSPITAL_COMMUNITY): Payer: Self-pay

## 2017-02-01 ENCOUNTER — Emergency Department (HOSPITAL_COMMUNITY): Payer: 59

## 2017-02-01 ENCOUNTER — Emergency Department (HOSPITAL_COMMUNITY)
Admission: EM | Admit: 2017-02-01 | Discharge: 2017-02-01 | Disposition: A | Discharge status: Home / Self Care | Payer: 59 | Attending: Emergency Medicine | Admitting: Emergency Medicine

## 2017-02-01 DIAGNOSIS — N202 Calculus of kidney with calculus of ureter: Secondary | ICD-10-CM | POA: Diagnosis not present

## 2017-02-01 DIAGNOSIS — N200 Calculus of kidney: Secondary | ICD-10-CM

## 2017-02-01 DIAGNOSIS — N132 Hydronephrosis with renal and ureteral calculous obstruction: Secondary | ICD-10-CM | POA: Diagnosis not present

## 2017-02-01 DIAGNOSIS — R109 Unspecified abdominal pain: Secondary | ICD-10-CM | POA: Diagnosis present

## 2017-02-01 DIAGNOSIS — N23 Unspecified renal colic: Secondary | ICD-10-CM

## 2017-02-01 HISTORY — DX: Diverticulitis of intestine, part unspecified, without perforation or abscess without bleeding: K57.92

## 2017-02-01 LAB — COMPREHENSIVE METABOLIC PANEL
ALBUMIN: 4.1 g/dL (ref 3.5–5.0)
ALK PHOS: 100 U/L (ref 38–126)
ALT: 17 U/L (ref 17–63)
ANION GAP: 7 (ref 5–15)
AST: 20 U/L (ref 15–41)
BILIRUBIN TOTAL: 1 mg/dL (ref 0.3–1.2)
BUN: 23 mg/dL — AB (ref 6–20)
CALCIUM: 9 mg/dL (ref 8.9–10.3)
CO2: 26 mmol/L (ref 22–32)
CREATININE: 1.41 mg/dL — AB (ref 0.61–1.24)
Chloride: 107 mmol/L (ref 101–111)
GFR calc Af Amer: >60 mL/min (ref >60)
GFR calc non Af Amer: 53 mL/min — ABNORMAL LOW (ref >60)
GLUCOSE: 115 mg/dL — AB (ref 65–99)
Potassium: 4 mmol/L (ref 3.5–5.1)
SODIUM: 140 mmol/L (ref 135–145)
Total Protein: 7 g/dL (ref 6.5–8.1)

## 2017-02-01 LAB — URINALYSIS, ROUTINE W REFLEX MICROSCOPIC
BILIRUBIN URINE: NEGATIVE
Bacteria, UA: NONE SEEN
GLUCOSE, UA: NEGATIVE mg/dL
Ketones, ur: NEGATIVE mg/dL
Leukocytes, UA: NEGATIVE
Nitrite: NEGATIVE
PROTEIN: NEGATIVE mg/dL
SPECIFIC GRAVITY, URINE: 1.029 (ref 1.005–1.030)
pH: 5 (ref 5.0–8.0)

## 2017-02-01 LAB — CBC
HCT: 43.9 % (ref 39.0–52.0)
HEMOGLOBIN: 15.8 g/dL (ref 13.0–17.0)
MCH: 31.5 pg (ref 26.0–34.0)
MCHC: 36 g/dL (ref 30.0–36.0)
MCV: 87.6 fL (ref 78.0–100.0)
Platelets: 241 10*3/uL (ref 150–400)
RBC: 5.01 MIL/uL (ref 4.22–5.81)
RDW: 13.7 % (ref 11.5–15.5)
WBC: 10.6 10*3/uL — ABNORMAL HIGH (ref 4.0–10.5)

## 2017-02-01 LAB — LIPASE, BLOOD: Lipase: 27 U/L (ref 11–51)

## 2017-02-01 MED ORDER — ONDANSETRON HCL 4 MG/2ML IJ SOLN
4.0000 mg | Freq: Once | INTRAMUSCULAR | Status: AC
  Administered 2017-02-01: 4 mg via INTRAVENOUS
  Filled 2017-02-01: qty 2

## 2017-02-01 MED ORDER — OXYCODONE-ACETAMINOPHEN 5-325 MG PO TABS: 1.0000 | ORAL_TABLET | ORAL | 0 refills | Status: DC | PRN

## 2017-02-01 MED ORDER — ONDANSETRON 4 MG PO TBDP: 4.0000 mg | ORAL_TABLET | Freq: Three times a day (TID) | ORAL | 0 refills | Status: DC | PRN

## 2017-02-01 MED ORDER — SODIUM CHLORIDE 0.9 % IV BOLUS (SEPSIS)
1000.0000 mL | Freq: Once | INTRAVENOUS | Status: AC
  Administered 2017-02-01: 1000 mL via INTRAVENOUS

## 2017-02-01 MED ORDER — FENTANYL CITRATE (PF) 100 MCG/2ML IJ SOLN
50.0000 ug | Freq: Once | INTRAMUSCULAR | Status: AC
  Administered 2017-02-01: 50 ug via INTRAVENOUS
  Filled 2017-02-01: qty 2

## 2017-02-01 MED ORDER — IBUPROFEN 800 MG PO TABS: 800.0000 mg | ORAL_TABLET | Freq: Three times a day (TID) | ORAL | 0 refills | Status: DC

## 2017-02-01 MED ORDER — TAMSULOSIN HCL 0.4 MG PO CAPS: 0.4000 mg | ORAL_CAPSULE | Freq: Every day | ORAL | 0 refills | Status: DC

## 2017-02-01 MED ORDER — KETOROLAC TROMETHAMINE 30 MG/ML IJ SOLN
30.0000 mg | Freq: Once | INTRAMUSCULAR | Status: AC
  Administered 2017-02-01: 30 mg via INTRAVENOUS
  Filled 2017-02-01: qty 1

## 2017-02-01 NOTE — ED Triage Notes (Signed)
States abdominal pain since yesterday had same symptoms in October last year and dx with diverticulitis no fever no n/v no diarrhea voiced.

## 2017-02-01 NOTE — ED Provider Notes (Signed)
[facility]-EMERGENCY DEPT Provider Note   CSN: [id number] Arrival date & time: 02/01/17  1191     History   Chief Complaint Chief Complaint  Patient presents with  . Abdominal Pain    HPI Dale Hawkins is a 61 y.o. male.  Patient with progressively worsening right-sided abdominal pain since last night. Associated with nausea but no vomiting. No diarrhea. History of diverticulitis as well as kidney stones. Patient doesn't feel like this represents a kidney stone. Pain is getting worse and is fairly constant. It is not moved at all. It radiates to his right groin. Denies any pain with urination or hematuria. Denies any fever. Denies any chills. Denies any chest pain or shortness of breath. Denies any testicular pain.   The history is provided by the patient.  Abdominal Pain   Associated symptoms include nausea. Pertinent negatives include fever, diarrhea, vomiting, dysuria, hematuria, headaches, arthralgias and myalgias.    Past Medical History:  Diagnosis Date  . Anxiety   . Depression   . Diverticulitis   . Kidney stone     There are no active problems to display for this patient.   Past Surgical History:  Procedure Laterality Date  . CYST REMOVAL TRUNK    . NECK SURGERY  2004       Home Medications    Prior to Admission medications   Medication Sig Start Date End Date Taking? Authorizing Provider  acetaminophen (TYLENOL) 500 MG tablet Take 1,000 mg by mouth daily as needed for pain.    Historical Provider, MD  ALPRAZolam Prudy Feeler) 0.5 MG tablet Take 0.25 mg by mouth 2 (two) times daily as needed for anxiety.    Historical Provider, MD  hydrOXYzine (ATARAX/VISTARIL) 25 MG tablet Take 2 tablets (50 mg total) by mouth every 8 (eight) hours as needed for anxiety (and sleep). 08/29/16   Shaune Pollack, MD  nystatin (MYCOSTATIN) 100000 UNIT/ML suspension Take 4 mLs by mouth 4 (four) times daily. Use as mouthwash 4 times daily 08/26/16   Historical Provider, MD  ondansetron  (ZOFRAN ODT) 4 MG disintegrating tablet Take 1 tablet (4 mg total) by mouth every 8 (eight) hours as needed for nausea or vomiting. Patient not taking: Reported on 08/29/2016 07/09/16   Canary Brim Tegeler, MD  oxyCODONE-acetaminophen (PERCOCET/ROXICET) 5-325 MG tablet Take 1 tablet by mouth every 4 (four) hours as needed for severe pain. Patient not taking: Reported on 08/29/2016 07/09/16   Canary Brim Tegeler, MD  PRESCRIPTION MEDICATION Apply 1 application topically daily.    Historical Provider, MD    Family History History reviewed. No pertinent family history.  Social History Social History  Substance Use Topics  . Smoking status: Never Smoker  . Smokeless tobacco: Never Used  . Alcohol use No     Allergies   Patient has no known allergies.   Review of Systems Review of Systems  Constitutional: Negative for activity change, chills and fever.  HENT: Negative for congestion.   Respiratory: Negative for cough, chest tightness and shortness of breath.   Cardiovascular: Negative for chest pain.  Gastrointestinal: Positive for abdominal pain and nausea. Negative for diarrhea and vomiting.  Genitourinary: Negative for dysuria and hematuria.  Musculoskeletal: Negative for arthralgias, back pain and myalgias.  Skin: Negative for wound.  Neurological: Negative for dizziness, weakness and headaches.    all other systems are negative except as noted in the HPI and PMH.    Physical Exam Updated Vital Signs BP (!) 181/96 (BP Location: Right Arm)  Pulse (!) 54   Temp 97.9 F (36.6 C) (Oral)   Resp 18   Ht  (1.803 m)   Wt 180 lb (81.6 kg)   SpO2 99%   BMI 25.10 kg/m   Physical Exam  Constitutional: He is oriented to person, place, and time. He appears well-developed and well-nourished. He appears distressed.  uncomfortable  HENT:  Head: Normocephalic and atraumatic.  Mouth/Throat: Oropharynx is clear and moist. No oropharyngeal exudate.  Eyes: Conjunctivae and  EOM are normal. Pupils are equal, round, and reactive to light.  Neck: Normal range of motion. Neck supple.  No meningismus.  Cardiovascular: Normal rate, regular rhythm, normal heart sounds and intact distal pulses.   No murmur heard. Pulmonary/Chest: Effort normal and breath sounds normal. No respiratory distress.  Abdominal: Soft. There is tenderness. There is guarding. There is no rebound.  Pain to palpation the right lower quadrant with guarding, no rebound, abdomen is soft.  Genitourinary:  Genitourinary Comments: No testicular tenderness  Musculoskeletal: Normal range of motion. He exhibits no edema or tenderness.  Neurological: He is alert and oriented to person, place, and time. No cranial nerve deficit. He exhibits normal muscle tone. Coordination normal.  No ataxia on finger to nose bilaterally. No pronator drift. 5/5 strength throughout. CN 2-12 intact.Equal grip strength. Sensation intact.   Skin: Skin is warm.  Psychiatric: He has a normal mood and affect. His behavior is normal.  Nursing note and vitals reviewed.    ED Treatments / Results  Labs (all labs ordered are listed, but only abnormal results are displayed) Labs Reviewed  COMPREHENSIVE METABOLIC PANEL - Abnormal; Notable for the following:       Result Value   Glucose, Bld 115 (*)    BUN 23 (*)    Creatinine, Ser 1.41 (*)    GFR calc non Af Amer 53 (*)    All other components within normal limits  CBC - Abnormal; Notable for the following:    WBC 10.6 (*)    All other components within normal limits  URINALYSIS, ROUTINE W REFLEX MICROSCOPIC - Abnormal; Notable for the following:    Hgb urine dipstick MODERATE (*)    Squamous Epithelial / LPF 0-5 (*)    All other components within normal limits  LIPASE, BLOOD    EKG  EKG Interpretation None       Radiology Ct Renal Stone Study  Result Date: 02/01/2017 CLINICAL DATA:  RIGHT lower quadrant pain for 2 days, microscopic hematuria. History of  diverticulitis and kidney stones requiring lithotripsy. EXAM: CT ABDOMEN AND PELVIS WITHOUT CONTRAST TECHNIQUE: Multidetector CT imaging of the abdomen and pelvis was performed following the standard protocol without IV contrast. COMPARISON:  CT abdomen and pelvis July 09, 2016 FINDINGS: LOWER CHEST: Dependent atelectasis. The visualized heart size is normal. No pericardial effusion. HEPATOBILIARY: Normal. PANCREAS: Normal. SPLEEN: Normal. ADRENALS/URINARY TRACT: Kidneys are orthotopic, demonstrating normal size and morphology. Moderate RIGHT hydroureteronephrosis the level of the distal ureter where a 5 mm calculus is present. At least 7 residual RIGHT nephrolithiasis measuring to 5 mm. Multiple punctate LEFT nephrolithiasis in addition to 4 mm interpolar calculus. Limited assessment for renal masses on this nonenhanced examination. The unopacified ureters are normal in course and caliber. Urinary bladder is partially distended and unremarkable. Normal adrenal glands. STOMACH/BOWEL: The stomach, small and large bowel are normal in course and caliber without inflammatory changes, sensitivity decreased by lack of enteric contrast. Moderate volume LEFT colonic diverticulosis, a few additional scattered diverticula. Normal  appendix. VASCULAR/LYMPHATIC: Aortoiliac vessels are normal in course and caliber. No lymphadenopathy by CT size criteria. REPRODUCTIVE: Mild prostatomegaly. OTHER: No intraperitoneal free fluid or free air. MUSCULOSKELETAL: Non-acute. Some small fat containing RIGHT inguinal hernia. Small fat containing umbilical hernia. IMPRESSION: 5 mm distal RIGHT ureteral calculus resulting in moderate obstructive uropathy. Multiple bilateral nephrolithiasis measuring to 5 mm. Electronically Signed   By: Awilda Metro M.D.   On: 02/01/2017 06:29    Procedures Procedures (including critical care time)  Medications Ordered in ED Medications  fentaNYL (SUBLIMAZE) injection 50 mcg (not administered)    ondansetron (ZOFRAN) injection 4 mg (not administered)  sodium chloride 0.9 % bolus 1,000 mL (not administered)  ketorolac (TORADOL) 30 MG/ML injection 30 mg (not administered)     Initial Impression / Assessment and Plan / ED Course  I have reviewed the triage vital signs and the nursing notes.  Pertinent labs & imaging results that were available during my care of the patient were reviewed by me and considered in my medical decision making (see chart for details).    Right lower quadrant pain with nausea. History of kidney stones as well as diverticulitis. Denies diarrhea or fever.  UA with hematuria. No infection.  Mild elevation of creatinine.  CT of 5 mm right-sided mid ureteral calculus with moderate hydronephrosis. No infection. Pain and nausea are controlled in the ED. Appendix normal.    Patient tolerating by mouth and ambulatory. We'll treat symptomatically for ureteral colic and follow-up with urology. Return precautions discussed.  BP 130/86 (BP Location: Right Arm)   Pulse 62   Temp 97.9 F (36.6 C) (Oral)   Resp 20   Ht  (1.803 m)   Wt 180 lb (81.6 kg)   SpO2 96%   BMI 25.10 kg/m   Final Clinical Impressions(s) / ED Diagnoses   Final diagnoses:  Ureteral colic  Kidney stone    New Prescriptions New Prescriptions   No medications on file     Glynn Octave, MD 02/01/17 [phone]

## 2017-02-01 NOTE — ED Notes (Addendum)
Pt has hx of diverticulitis, and says the pain feels similar to the last episode. Today, pt c/o 7/10 lower abdominal pain, no n/v. No hx of hypertension.

## 2017-02-01 NOTE — ED Notes (Signed)
Sprite given 

## 2017-02-01 NOTE — Discharge Instructions (Signed)
Take the pain and nausea medications as prescribed. Follow-up with the urologist. Return to the ED if you develop worsening pain, fever, vomiting or any other concerns.

## 2017-02-19 DIAGNOSIS — R102 Pelvic and perineal pain: Secondary | ICD-10-CM | POA: Diagnosis not present

## 2017-03-03 DIAGNOSIS — M5030 Other cervical disc degeneration, unspecified cervical region: Secondary | ICD-10-CM | POA: Diagnosis not present

## 2017-03-18 ENCOUNTER — Ambulatory Visit (HOSPITAL_COMMUNITY): Payer: 59 | Admitting: Psychiatry

## 2017-06-30 ENCOUNTER — Ambulatory Visit (INDEPENDENT_AMBULATORY_CARE_PROVIDER_SITE_OTHER): Payer: 59 | Admitting: Psychiatry

## 2017-06-30 ENCOUNTER — Encounter (HOSPITAL_COMMUNITY): Payer: Self-pay | Admitting: Psychiatry

## 2017-06-30 VITALS — BP 128/72 | HR 84 | Ht 70.0 in | Wt 178.0 lb

## 2017-06-30 DIAGNOSIS — Z566 Other physical and mental strain related to work: Secondary | ICD-10-CM

## 2017-06-30 DIAGNOSIS — Z818 Family history of other mental and behavioral disorders: Secondary | ICD-10-CM

## 2017-06-30 DIAGNOSIS — F4323 Adjustment disorder with mixed anxiety and depressed mood: Secondary | ICD-10-CM | POA: Diagnosis not present

## 2017-06-30 MED ORDER — ALPRAZOLAM 0.5 MG PO TABS
0.2500 mg | ORAL_TABLET | Freq: Two times a day (BID) | ORAL | 3 refills | Status: DC | PRN
Start: 2017-06-30 — End: 2018-07-08

## 2017-06-30 MED ORDER — ALPRAZOLAM 0.25 MG PO TABS: ORAL_TABLET | ORAL | 3 refills | Status: DC

## 2017-06-30 NOTE — Progress Notes (Signed)
Psychiatric Initial Adult Assessment   Patient Identification: Dale Hawkins MRN:  [id number] Date of Evaluation:  06/30/2017 Referral Source Dr. 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 He has no past psychiatric history. He's experiencing a great deal of stress at work. He's worked in the same position for 30 or 40 years. Company is apparently beginning, upon some part financial times. The owner spoke with an imply there may be changes were that the company may no longer continue. They were directed statements the patient clearly has become anxious at the time he seems to be failing. He's reached out to look for other employment. He believes he can find it. Financially this patient is very stable. 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 The patient been married for 40 years in a stable marriage. His 2 sons are in their 30s and he has 2 grandchildren all of them are doing well. The patient does have some chronic mild headaches and is seen headache specialist taking many medications over the years with little help. His headache is not severe but it is constant. When the patient is extremely anxious particular around work and afflicts his sleep. At times has taken a small dose of Xanax and got good response with sleep. He actually rarely takes it. He maybe takes it once or twice a month. This patient denies daily depression. He is eating well has good energy. He concentrate well. He's not suicidal now and never has. Enjoys reading growing blueberries and watching TV. The patient denies the use of any alcohol significance. He denies the use of substances like marijuana or other illicit agents. The patient is never had any psychotic symptoms. He denies a clear episode of major depression and never has had mania. He denies symptoms of generalized anxiety disorder, panic disorder or obsessive-compulsive disorder. At this time is experiencing moderate  anxiety which does affect him during the day but mostly impairs his ability to sleep at night. The patient grew up in [facility]. He went to [employer] [employer] [facility] [employer] [facility] [employer] for one year. The patient has no past psychiatric history. He's never been in a psychiatric hospital and essentially is never really been evaluated by a psychiatrist. He's never been in any therapy. At one time try to work. One point his primary care Dr. Trinna Hawkins for his anxiety side effects. At this time the patient is very stable.  Chief Complaint:   Chief Complaint    Depression; Anxiety     Visit Diagnosis: No diagnosis found.  History of Present Illness:    Associated Signs/Symptoms: Depression Symptoms:  anxiety, (Hypo) Manic Symptoms:   Anxiety Symptoms:   Psychotic Symptoms:   PTSD Symptoms:   Past Psychiatric History:   Previous Psychotropic Medications: Yes   Substance Abuse History in the last 12 months:  No.  Consequences of Substance Abuse:   Past Medical History:  Past Medical History:  Diagnosis Date  . Anxiety   . Depression   . Diverticulitis   . Kidney stone     Past Surgical History:  Procedure Laterality Date  . CYST REMOVAL TRUNK    . NECK SURGERY  2004    Family Psychiatric History:   Family History:  Family History  Problem Relation Age of Onset  . ADD / ADHD Son   . ADD / ADHD Son     Social History:   Social History   Social History  . Marital status:  Married    Spouse name: N/A  . Number of children: N/A  . Years of education: N/A   Social History Main Topics  . Smoking status: Never Smoker  . Smokeless tobacco: Never Used  . Alcohol use No  . Drug use: No  . Sexual activity: Yes    Partners: Female    Birth control/ protection: None   Other Topics Concern  . None   Social History Narrative  . None    Additional Social History:   Allergies:  No Known Allergies  Metabolic Disorder Labs: No results found for: HGBA1C, MPG No results found for:  PROLACTIN No results found for: CHOL, TRIG, HDL, CHOLHDL, VLDL, LDLCALC   Current Medications: Current Outpatient Prescriptions  Medication Sig Dispense Refill  . ibuprofen (ADVIL,MOTRIN) 800 MG tablet Take 1 tablet (800 mg total) by mouth 3 (three) times daily. 21 tablet 0  . ALPRAZolam (XANAX) 0.25 MG tablet 1  qhs prn 20 tablet 3  . ALPRAZolam (XANAX) 0.5 MG tablet Take 0.5 tablets (0.25 mg total) by mouth 2 (two) times daily as needed for anxiety. 20 tablet 3  . hydrOXYzine (ATARAX/VISTARIL) 25 MG tablet Take 2 tablets (50 mg total) by mouth every 8 (eight) hours as needed for anxiety (and sleep). (Patient not taking: Reported on 02/01/2017) 20 tablet 0  . ondansetron (ZOFRAN ODT) 4 MG disintegrating tablet Take 1 tablet (4 mg total) by mouth every 8 (eight) hours as needed for nausea or vomiting. (Patient not taking: Reported on 06/30/2017) 20 tablet 0  . oxyCODONE-acetaminophen (PERCOCET/ROXICET) 5-325 MG tablet Take 1 tablet by mouth every 4 (four) hours as needed for severe pain. (Patient not taking: Reported on 06/30/2017) 15 tablet 0  . PRESCRIPTION MEDICATION Apply 1 application topically daily as needed (rosacea).     . tamsulosin (FLOMAX) 0.4 MG CAPS capsule Take 1 capsule (0.4 mg total) by mouth daily. (Patient not taking: Reported on 06/30/2017) 10 capsule 0   No current facility-administered medications for this visit.     Neurologic: Headache: Yes Seizure: No Paresthesias:No  Musculoskeletal: Strength & Muscle Tone: within normal limits Gait & Station: normal Patient leans: N/A  Psychiatric Specialty Exam: ROS  Blood pressure 128/72, pulse 84, height  (1.778 m), weight 178 lb (80.7 kg).Body mass index is 25.54 kg/m.  General Appearance: Meticulous  Eye Contact:  Good  Speech:  Clear and Coherent  Volume:  Normal  Mood:  Euthymic  Affect:  Congruent  Thought Process:  Goal Directed  Orientation:  Full (Time, Place, and Person)  Thought Content:  WDL   Suicidal Thoughts:  No  Homicidal Thoughts:  No  Memory:  NA  Judgement:  Good  Insight:  Good  Psychomotor Activity:  Normal  Concentration:    Recall:  Good  Fund of Knowledge:Good  Language: Good  Akathisia:  No  Handed:  Right  AIMS (if indicated):    Assets:  Communication Skills  ADL's:  Intact  Cognition: WNL  Sleep:        Treatment Plan Summary:Dale Hawkins I Dale Ludemann, MD 9/26/20184:08 PM   At this time this patient is very healthy and well. Unfortunately is experiencing some situational anxiety directly related to work stress. He does affect his life on a day-to-day basis because it affects his ability to sleep. When he is taking Xanax on a when necessary rare basis it helps him sleep. This patient shows no evidence of misuse or abuse. Today we had a long discussion of the potential problems  with Xanax and the possibility of abuse. I do not think this patient will use this agent at all. We talked about the difference of this a lot dependency and the dangers of withdrawal. I shared with the patient taking an extremely small dose of Xanax recall none of these things. He essentially would take 0.25 mg a few times a week at night for sleep. Taking that and help him a great deal. At this time he is under a great deal of stress as his company is been with for years is beginning to fail. I think the patient would clearly benefit by taking the smallest dose of Xanax on a when necessary basis at night. This patient will return to see me in 3 months and received a prescription for Xanax 0.25 mg 20 pills with 3 refills.

## 2017-08-31 DIAGNOSIS — N200 Calculus of kidney: Secondary | ICD-10-CM | POA: Diagnosis not present

## 2017-08-31 DIAGNOSIS — R972 Elevated prostate specific antigen [PSA]: Secondary | ICD-10-CM | POA: Diagnosis not present

## 2017-09-24 ENCOUNTER — Ambulatory Visit (HOSPITAL_COMMUNITY): Payer: 59 | Admitting: Psychiatry

## 2017-10-26 DIAGNOSIS — R0981 Nasal congestion: Secondary | ICD-10-CM | POA: Diagnosis not present

## 2017-11-24 DIAGNOSIS — M5030 Other cervical disc degeneration, unspecified cervical region: Secondary | ICD-10-CM | POA: Diagnosis not present

## 2017-11-24 DIAGNOSIS — M7541 Impingement syndrome of right shoulder: Secondary | ICD-10-CM | POA: Diagnosis not present

## 2017-11-25 ENCOUNTER — Ambulatory Visit (HOSPITAL_COMMUNITY): Payer: 59 | Admitting: Psychiatry

## 2017-12-06 DIAGNOSIS — L821 Other seborrheic keratosis: Secondary | ICD-10-CM | POA: Diagnosis not present

## 2017-12-06 DIAGNOSIS — L814 Other melanin hyperpigmentation: Secondary | ICD-10-CM | POA: Diagnosis not present

## 2017-12-06 DIAGNOSIS — L738 Other specified follicular disorders: Secondary | ICD-10-CM | POA: Diagnosis not present

## 2017-12-17 DIAGNOSIS — S335XXA Sprain of ligaments of lumbar spine, initial encounter: Secondary | ICD-10-CM | POA: Diagnosis not present

## 2017-12-17 DIAGNOSIS — M47816 Spondylosis without myelopathy or radiculopathy, lumbar region: Secondary | ICD-10-CM | POA: Diagnosis not present

## 2017-12-24 ENCOUNTER — Ambulatory Visit (HOSPITAL_COMMUNITY): Payer: 59 | Admitting: Psychiatry

## 2017-12-24 ENCOUNTER — Encounter (HOSPITAL_COMMUNITY): Payer: Self-pay | Admitting: Psychiatry

## 2017-12-24 VITALS — BP 148/98 | HR 65 | Ht 70.0 in | Wt 192.0 lb

## 2017-12-24 DIAGNOSIS — F4323 Adjustment disorder with mixed anxiety and depressed mood: Secondary | ICD-10-CM

## 2017-12-24 DIAGNOSIS — Z79899 Other long term (current) drug therapy: Secondary | ICD-10-CM

## 2017-12-24 DIAGNOSIS — F4322 Adjustment disorder with anxiety: Secondary | ICD-10-CM | POA: Diagnosis not present

## 2017-12-24 MED ORDER — ALPRAZOLAM 0.25 MG PO TABS: ORAL_TABLET | ORAL | 4 refills | Status: DC

## 2017-12-24 MED ORDER — SUVOREXANT 10 MG PO TABS: 10.0000 [IU] | ORAL_TABLET | ORAL | 5 refills | Status: AC

## 2017-12-24 NOTE — Progress Notes (Signed)
Psychiatric Initial Adult Assessment   Patient Identification: Dale Hawkins MRN:  [id number] Date of Evaluation:  12/24/2017 Referral Source Dr. Dyane Dustman  Today the patient is seen on time. He is doing actually well. Work is gotten better. It's more stable. No really know how stable visit about or months. 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 A close evaluation the Xanax at night has a minimal benefit. He continues to have disrupted sleep. Importantly it doesn't seem to have that being of an effect on the next day. He does not take naps and doesn't feel sleepy that much. Is not clear if he will have less anxiety if he got a good night sleep. This sleep pattern is been present for years albeit it's worse in the last 2 years since the stress occurred at work. The patient denies daily depression. He denies any other psychiatric symptomatology. There are or 2 times during the week where he feels very anxious and very uncomfortable and is taking a small dose of Xanax and feels much better. This occurs perhaps once a week.the patient is been taking the Xanax on a regular basis on a rare basis. Essentially 20 pills a month or more that he needs. The patient does not drink any alcohol or use any drugs.  Visit Diagnosis: No diagnosis found.  History of Present Illness:    Associated Signs/Symptoms: Depression Symptoms:  anxiety, (Hypo) Manic Symptoms:   Anxiety Symptoms:   Psychotic Symptoms:   PTSD Symptoms:   Past Psychiatric History:   Previous Psychotropic Medications: Yes   Substance Abuse History in the last 12 months:  No.  Consequences of Substance Abuse:   Past Medical History:  Past Medical History:  Diagnosis Date  . Anxiety   . Depression   . Diverticulitis   . Kidney stone     Past Surgical History:  Procedure Laterality Date  . CYST REMOVAL TRUNK    . NECK SURGERY  2004    Family Psychiatric  History:   Family History:  Family History  Problem Relation Age of Onset  . ADD / ADHD Son   . ADD / ADHD Son     Social History:   Social History   Socioeconomic History  . Marital status: Married    Spouse name: Not on file  . Number of children: Not on file  . Years of education: Not on file  . Highest education level: Not on file  Occupational History  . Not on file  Social Needs  . Financial resource strain: Not on file  . Food insecurity:    Worry: Not on file    Inability: Not on file  . Transportation needs:    Medical: Not on file    Non-medical: Not on file  Tobacco Use  . Smoking status: Never Smoker  . Smokeless tobacco: Never Used  Substance and Sexual Activity  . Alcohol use: No  . Drug use: No  . Sexual activity: Yes    Partners: Female    Birth control/protection: None  Lifestyle  . Physical activity:    Days per week: Not on file    Minutes per session: Not on file  . Stress: Not on file  Relationships  . Social connections:    Talks on phone: Not on file    Gets together: Not on file    Attends religious service: Not on file  Active member of club or organization: Not on file    Attends meetings of clubs or organizations: Not on file    Relationship status: Not on file  Other Topics Concern  . Not on file  Social History Narrative  . Not on file    Additional Social History:   Allergies:  No Known Allergies  Metabolic Disorder Labs: No results found for: HGBA1C, MPG No results found for: PROLACTIN No results found for: CHOL, TRIG, HDL, CHOLHDL, VLDL, LDLCALC   Current Medications: Current Outpatient Medications  Medication Sig Dispense Refill  . ALPRAZolam (XANAX) 0.25 MG tablet 1  qhs prn 15 tablet 4  . ALPRAZolam (XANAX) 0.5 MG tablet Take 0.5 tablets (0.25 mg total) by mouth 2 (two) times daily as needed for anxiety. 20 tablet 3  . ibuprofen (ADVIL,MOTRIN) 800 MG tablet Take 1 tablet (800 mg total) by mouth 3 (three) times  daily. 21 tablet 0  . PRESCRIPTION MEDICATION Apply 1 application topically daily as needed (rosacea).     . hydrOXYzine (ATARAX/VISTARIL) 25 MG tablet Take 2 tablets (50 mg total) by mouth every 8 (eight) hours as needed for anxiety (and sleep). (Patient not taking: Reported on 02/01/2017) 20 tablet 0  . ondansetron (ZOFRAN ODT) 4 MG disintegrating tablet Take 1 tablet (4 mg total) by mouth every 8 (eight) hours as needed for nausea or vomiting. (Patient not taking: Reported on 06/30/2017) 20 tablet 0  . oxyCODONE-acetaminophen (PERCOCET/ROXICET) 5-325 MG tablet Take 1 tablet by mouth every 4 (four) hours as needed for severe pain. (Patient not taking: Reported on 06/30/2017) 15 tablet 0  . Suvorexant (BELSOMRA) 10 MG TABS Take 10 Units by mouth 1 day or 1 dose for 1 dose. 30 tablet 5  . tamsulosin (FLOMAX) 0.4 MG CAPS capsule Take 1 capsule (0.4 mg total) by mouth daily. (Patient not taking: Reported on 06/30/2017) 10 capsule 0   No current facility-administered medications for this visit.     Neurologic: Headache: Yes Seizure: No Paresthesias:No  Musculoskeletal: Strength & Muscle Tone: within normal limits Gait & Station: normal Patient leans: N/A  Psychiatric Specialty Exam: ROS  Blood pressure (!) 148/98, pulse 65, height 5\' 10"  (1.778 m), weight 192 lb (87.1 kg), SpO2 96 %.Body mass index is 27.55 kg/m.  General Appearance: Meticulous  Eye Contact:  Good  Speech:  Clear and Coherent  Volume:  Normal  Mood:  Euthymic  Affect:  Congruent  Thought Process:  Goal Directed  Orientation:  Full (Time, Place, and Person)  Thought Content:  WDL  Suicidal Thoughts:  No  Homicidal Thoughts:  No  Memory:  NA  Judgement:  Good  Insight:  Good  Psychomotor Activity:  Normal  Concentration:    Recall:  Good  Fund of Knowledge:Good  Language: Good  Akathisia:  No  Handed:  Right  AIMS (if indicated):    Assets:  Communication Skills  ADL's:  Intact  Cognition: WNL  Sleep:         Treatment Plan Summary:Alireza Pollack I Autie Vasudevan, MD 3/22/20199:02 AM    At this time we will make to interventions. We talked about good sleep hygiene importance of sleeping well although he doesn't seem to have that much dysfunction. It is possible that by not sleeping well at night is less tolerant of stress during the day and experiences is distressed as anxiety. At this time we'll begin on Belsomrar 10 mg and try to take it on a regular basis. We'll give a trial but it doesn't  work he'll just dropped. We will go ahead and continue his Xanax at 0.25 mg will be given 15 per month it'll take either at night or during the workweek when he needs it. He shows no evidence of abuse. We'll be treating him with the concept of an adjustment disorder with anxious mood state and clarify to him that in the next 3-6 months he'll be clear it is job is going to go away or not. Once the numbers come in and if his boss decides to keep things going at that time we'll likely discontinue the use of Xanax. He uses it very appropriately minimally and is very responsible. The patient denies any physical complaints at this time. He'll return to see me in 5 months.

## 2018-03-04 DIAGNOSIS — J069 Acute upper respiratory infection, unspecified: Secondary | ICD-10-CM | POA: Diagnosis not present

## 2018-03-09 DIAGNOSIS — J069 Acute upper respiratory infection, unspecified: Secondary | ICD-10-CM | POA: Diagnosis not present

## 2018-03-09 DIAGNOSIS — Z111 Encounter for screening for respiratory tuberculosis: Secondary | ICD-10-CM | POA: Diagnosis not present

## 2018-05-20 ENCOUNTER — Ambulatory Visit (HOSPITAL_COMMUNITY): Payer: Self-pay | Admitting: Psychiatry

## 2018-06-17 DIAGNOSIS — R030 Elevated blood-pressure reading, without diagnosis of hypertension: Secondary | ICD-10-CM | POA: Diagnosis not present

## 2018-06-17 DIAGNOSIS — S30860A Insect bite (nonvenomous) of lower back and pelvis, initial encounter: Secondary | ICD-10-CM | POA: Diagnosis not present

## 2018-06-17 DIAGNOSIS — W57XXXA Bitten or stung by nonvenomous insect and other nonvenomous arthropods, initial encounter: Secondary | ICD-10-CM | POA: Diagnosis not present

## 2018-07-08 ENCOUNTER — Other Ambulatory Visit (HOSPITAL_COMMUNITY): Payer: Self-pay

## 2018-07-08 MED ORDER — ALPRAZOLAM 0.25 MG PO TABS: ORAL_TABLET | ORAL | 1 refills | Status: DC

## 2018-07-26 ENCOUNTER — Ambulatory Visit (HOSPITAL_COMMUNITY): Payer: 59 | Admitting: Psychiatry

## 2018-08-04 DIAGNOSIS — Z23 Encounter for immunization: Secondary | ICD-10-CM | POA: Diagnosis not present

## 2018-08-29 DIAGNOSIS — R972 Elevated prostate specific antigen [PSA]: Secondary | ICD-10-CM | POA: Diagnosis not present

## 2018-08-29 DIAGNOSIS — N200 Calculus of kidney: Secondary | ICD-10-CM | POA: Diagnosis not present

## 2018-08-31 ENCOUNTER — Ambulatory Visit (HOSPITAL_COMMUNITY): Payer: 59 | Admitting: Psychiatry

## 2018-09-09 ENCOUNTER — Ambulatory Visit (HOSPITAL_COMMUNITY): Payer: 59 | Admitting: Psychiatry

## 2018-09-16 ENCOUNTER — Ambulatory Visit (HOSPITAL_COMMUNITY): Payer: 59 | Admitting: Psychiatry

## 2018-10-23 DIAGNOSIS — J0100 Acute maxillary sinusitis, unspecified: Secondary | ICD-10-CM | POA: Diagnosis not present

## 2018-11-10 DIAGNOSIS — Z0000 Encounter for general adult medical examination without abnormal findings: Secondary | ICD-10-CM | POA: Diagnosis not present

## 2018-12-09 DIAGNOSIS — I10 Essential (primary) hypertension: Secondary | ICD-10-CM | POA: Diagnosis not present

## 2018-12-09 DIAGNOSIS — R109 Unspecified abdominal pain: Secondary | ICD-10-CM | POA: Diagnosis not present

## 2018-12-09 DIAGNOSIS — Z87442 Personal history of urinary calculi: Secondary | ICD-10-CM | POA: Diagnosis not present

## 2019-03-15 ENCOUNTER — Other Ambulatory Visit (HOSPITAL_COMMUNITY): Payer: Self-pay

## 2019-03-16 ENCOUNTER — Other Ambulatory Visit (HOSPITAL_COMMUNITY): Payer: Self-pay

## 2019-03-16 MED ORDER — ALPRAZOLAM 0.25 MG PO TABS: ORAL_TABLET | ORAL | 1 refills | Status: DC

## 2019-03-16 NOTE — Progress Notes (Unsigned)
Filled per Dr. Casimiro Needle, patient has scheduled a follow up appointment

## 2019-05-19 ENCOUNTER — Encounter (HOSPITAL_COMMUNITY): Payer: Self-pay | Admitting: Psychiatry

## 2019-05-19 ENCOUNTER — Other Ambulatory Visit: Payer: Self-pay

## 2019-05-19 ENCOUNTER — Ambulatory Visit (INDEPENDENT_AMBULATORY_CARE_PROVIDER_SITE_OTHER): Payer: 59 | Admitting: Psychiatry

## 2019-05-19 VITALS — BP 140/78 | HR 74 | Temp 98.1°F | Ht 70.0 in | Wt 191.2 lb

## 2019-05-19 DIAGNOSIS — F4323 Adjustment disorder with mixed anxiety and depressed mood: Secondary | ICD-10-CM

## 2019-05-19 MED ORDER — ALPRAZOLAM 0.25 MG PO TABS: ORAL_TABLET | ORAL | 4 refills | Status: DC

## 2019-05-19 NOTE — Progress Notes (Signed)
Psychiatric Initial Adult Assessment   Patient Identification: Dale Hawkins MRN:  [id number] Date of Evaluation:  05/19/2019 Referral Source Dr. Margo Common    Visit Diagnosis: Adjustment disorder with an anxious mood state  Today the patient is doing well.  He is much improved.  He has not been seen in about a year.  The stress is mainly related to his work setting.  He has increased demands there and on a regular basis the stability of his employment is of issue.  On the other hand the patient is financially quite stable.  His wife is just retired.  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  The patient still has headaches but I think are tension related.  The sons and his grandchildren are doing well.  He denies daily depression.  He is sleeping the same.  He mainly has broken up sleep but it does not cause dysfunction the next day.  When he has an anxious day it very often will afflicted his sleep but this does not occur on a regular basis.  Taking a very small dose of Xanax on a as needed basis has been very helpful.  It is kept him out of psych will of having insomnia which would then exacerbate his anxiety.  When in fact he is having more anxiety than usual and he can tell it might affect his sleep by taking a very small dose of Xanax he sleeps better and wakes up feeling better.  The patient enjoys the television he likes to read and he takes long walks.  The patient denies any use of alcohol or drugs.  He has no psychosis.  He has no significant past psychiatric history other than the treatment I have offered him.  Today is her third visit.  He is handling the pandemic very well.  He denies chest pain shortness of breath or any respiratory symptoms.  Generally handle stress fairly well.  His primary care doctor was hesitant to prescribe Xanax and wanted to be cleared that there were no other significant psychiatric illnesses present and that there was justification for  prescribing any Xanax at all.  History of Present Illness:    Associated Signs/Symptoms: Depression Symptoms:  anxiety, (Hypo) Manic Symptoms:   Anxiety Symptoms:   Psychotic Symptoms:   PTSD Symptoms:   Past Psychiatric History:   Previous Psychotropic Medications: Yes   Substance Abuse History in the last 12 months:  No.  Consequences of Substance Abuse:   Past Medical History:  Past Medical History:  Diagnosis Date  . Anxiety   . Depression   . Diverticulitis   . Kidney stone     Past Surgical History:  Procedure Laterality Date  . CYST REMOVAL TRUNK    . NECK SURGERY  2004    Family Psychiatric History:   Family History:  Family History  Problem Relation Age of Onset  . ADD / ADHD Son   . ADD / ADHD Son     Social History:   Social History   Socioeconomic History  . Marital status: Married    Spouse name: Not on file  . Number of children: Not on file  . Years of education: Not on file  . Highest education level: Not on file  Occupational History  . Not on file  Social Needs  . Financial resource strain: Not on file  . Food insecurity    Worry: Not on file    Inability: Not on  file  . Transportation needs    Medical: Not on file    Non-medical: Not on file  Tobacco Use  . Smoking status: Never Smoker  . Smokeless tobacco: Never Used  Substance and Sexual Activity  . Alcohol use: No  . Drug use: No  . Sexual activity: Yes    Partners: Female    Birth control/protection: None  Lifestyle  . Physical activity    Days per week: Not on file    Minutes per session: Not on file  . Stress: Not on file  Relationships  . Social Musicianconnections    Talks on phone: Not on file    Gets together: Not on file    Attends religious service: Not on file    Active member of club or organization: Not on file    Attends meetings of clubs or organizations: Not on file    Relationship status: Not on file  Other Topics Concern  . Not on file  Social  History Narrative  . Not on file    Additional Social History:   Allergies:  No Known Allergies  Metabolic Disorder Labs: No results found for: HGBA1C, MPG No results found for: PROLACTIN No results found for: CHOL, TRIG, HDL, CHOLHDL, VLDL, LDLCALC   Current Medications: Current Outpatient Medications  Medication Sig Dispense Refill  . ALPRAZolam (XANAX) 0.25 MG tablet 1  qhs as needed 10 tablet 4  . ibuprofen (ADVIL,MOTRIN) 800 MG tablet Take 1 tablet (800 mg total) by mouth 3 (three) times daily. 21 tablet 0  . PRESCRIPTION MEDICATION Apply 1 application topically daily as needed (rosacea).     Marland Kitchen. amLODipine (NORVASC) 5 MG tablet Take 5 mg by mouth daily.     No current facility-administered medications for this visit.     Neurologic: Headache: Yes Seizure: No Paresthesias:No  Musculoskeletal: Strength & Muscle Tone: within normal limits Gait & Station: normal Patient leans: N/A  Psychiatric Specialty Exam: ROS  Blood pressure 140/78, pulse 74, temperature 98.1 F (36.7 C), height 5\' 10"  (1.778 m), weight 191 lb 3.2 oz (86.7 kg).Body mass index is 27.43 kg/m.  General Appearance: Meticulous  Eye Contact:  Good  Speech:  Clear and Coherent  Volume:  Normal  Mood:  Euthymic  Affect:  Congruent  Thought Process:  Goal Directed  Orientation:  Full (Time, Place, and Person)  Thought Content:  WDL  Suicidal Thoughts:  No  Homicidal Thoughts:  No  Memory:  NA  Judgement:  Good  Insight:  Good  Psychomotor Activity:  Normal  Concentration:    Recall:  Good  Fund of Knowledge:Good  Language: Good  Akathisia:  No  Handed:  Right  AIMS (if indicated):    Assets:  Communication Skills  ADL's:  Intact  Cognition: WNL  Sleep:        Treatment Plan Summary:Dale Hawkins I Dale Corvin, MD 8/14/20209:12 AM    At this time the patient has 1 psychiatric problem that is an adjustment disorder with an anxious mood state.  This also often has enough anxiety that it affects  his sleep at produces headache.  By taking Xanax 0.25 mg half of a pill (1.25) he gets a very good night sleep and anxiety is reduced.  He will take this small dose of Xanax possibly 2 or 3 times a month.  By doing that he has a small dose of Xanax it reduces his anxiety.  When he takes the Xanax he gets a good night sleep and catches up  on perhaps a bad week of poor sleep.  He also can anticipate when he is having a very anxious day the taking this tiny dose of Xanax will get a good night sleep.  This is all very reasonable.  Today I shared with him the safety of Xanax at this low dose.  I shared with him that there is no evidence at all that it produces dementia or results in addiction or abuse.  I will continue prescribing this low-dose of Xanax to be used on a as needed basis.  This patient will return to see me in 4-1/2 months.

## 2019-09-22 ENCOUNTER — Ambulatory Visit (HOSPITAL_COMMUNITY): Payer: 59 | Admitting: Psychiatry

## 2019-10-03 ENCOUNTER — Ambulatory Visit (INDEPENDENT_AMBULATORY_CARE_PROVIDER_SITE_OTHER): Payer: 59 | Admitting: Psychiatry

## 2019-10-03 ENCOUNTER — Other Ambulatory Visit: Payer: Self-pay

## 2019-10-03 DIAGNOSIS — F4323 Adjustment disorder with mixed anxiety and depressed mood: Secondary | ICD-10-CM

## 2019-10-03 MED ORDER — ALPRAZOLAM 0.25 MG PO TABS: ORAL_TABLET | ORAL | 3 refills | Status: DC

## 2019-10-03 NOTE — Progress Notes (Signed)
Psychiatric Initial Adult Assessment   Patient Identification: Dale Hawkins MRN:  [id number] Date of Evaluation:  10/03/2019 Referral Source Dr. Dyane Dustman    Visit Diagnosis: Adjustment disorder with an anxious mood state  Today the patient is doing well.  His anxiety is slightly increased.  He has had more headaches and more neck pain.  I suspect is related to the pandemic which she is not completely conscious up.  On the other hand the patient has few stresses.  He is very financially stable.  He is working regularly.  He does have some problems relaxing but for the most part is functioning extremely well.  He takes a very small dose of Xanax half of a 0.25 mg a few times a week.  With that he sleeps well and his headaches are improved although not completely gone.  The patient is very stable.  He denies daily depression.  At this time he is sleeping and eating well.  He is got good energy.  He has no evidence of psychosis.  He denies the use of drugs or alcohol.  He is very stable and functioning very well.  His 2 adult children who are doing well.  Has had a number of family visits that have gone very well.  He is not frightened of the pandemic at least consciously.  History of Present Illness:    Associated Signs/Symptoms: Depression Symptoms:  anxiety, (Hypo) Manic Symptoms:   Anxiety Symptoms:   Psychotic Symptoms:   PTSD Symptoms:   Past Psychiatric History:   Previous Psychotropic Medications: Yes   Substance Abuse History in the last 12 months:  No.  Consequences of Substance Abuse:   Past Medical History:  Past Medical History:  Diagnosis Date  . Anxiety   . Depression   . Diverticulitis   . Kidney stone     Past Surgical History:  Procedure Laterality Date  . CYST REMOVAL TRUNK    . NECK SURGERY  2004    Family Psychiatric History:   Family History:  Family History  Problem Relation Age of Onset  . ADD / ADHD Son   . ADD / ADHD Son     Social  History:   Social History   Socioeconomic History  . Marital status: Married    Spouse name: Not on file  . Number of children: Not on file  . Years of education: Not on file  . Highest education level: Not on file  Occupational History  . Not on file  Tobacco Use  . Smoking status: Never Smoker  . Smokeless tobacco: Never Used  Substance and Sexual Activity  . Alcohol use: No  . Drug use: No  . Sexual activity: Yes    Partners: Female    Birth control/protection: None  Other Topics Concern  . Not on file  Social History Narrative  . Not on file   Social Determinants of Health   Financial Resource Strain:   . Difficulty of Paying Living Expenses: Not on file  Food Insecurity:   . Worried About Programme researcher, broadcasting/film/video in the Last Year: Not on file  . Ran Out of Food in the Last Year: Not on file  Transportation Needs:   . Lack of Transportation (Medical): Not on file  . Lack of Transportation (Non-Medical): Not on file  Physical Activity:   . Days of Exercise per Week: Not on file  . Minutes of Exercise per Session: Not on file  Stress:   .  Feeling of Stress : Not on file  Social Connections:   . Frequency of Communication with Friends and Family: Not on file  . Frequency of Social Gatherings with Friends and Family: Not on file  . Attends Religious Services: Not on file  . Active Member of Clubs or Organizations: Not on file  . Attends Archivist Meetings: Not on file  . Marital Status: Not on file    Additional Social History:   Allergies:  No Known Allergies  Metabolic Disorder Labs: No results found for: HGBA1C, MPG No results found for: PROLACTIN No results found for: CHOL, TRIG, HDL, CHOLHDL, VLDL, LDLCALC   Current Medications: Current Outpatient Medications  Medication Sig Dispense Refill  . ALPRAZolam (XANAX) 0.25 MG tablet 1  qhs as needed 20 tablet 3  . amLODipine (NORVASC) 5 MG tablet Take 5 mg by mouth daily.    Marland Kitchen ibuprofen  (ADVIL,MOTRIN) 800 MG tablet Take 1 tablet (800 mg total) by mouth 3 (three) times daily. 21 tablet 0  . PRESCRIPTION MEDICATION Apply 1 application topically daily as needed (rosacea).      No current facility-administered medications for this visit.    Neurologic: Headache: Yes Seizure: No Paresthesias:No  Musculoskeletal: Strength & Muscle Tone: within normal limits Gait & Station: normal Patient leans: N/A  Psychiatric Specialty Exam: ROS  There were no vitals taken for this visit.There is no height or weight on file to calculate BMI.  General Appearance: Meticulous  Eye Contact:  Good  Speech:  Clear and Coherent  Volume:  Normal  Mood:  Euthymic  Affect:  Congruent  Thought Process:  Goal Directed  Orientation:  Full (Time, Place, and Person)  Thought Content:  WDL  Suicidal Thoughts:  No  Homicidal Thoughts:  No  Memory:  NA  Judgement:  Good  Insight:  Good  Psychomotor Activity:  Normal  Concentration:    Recall:  Good  Fund of Knowledge:Good  Language: Good  Akathisia:  No  Handed:  Right  AIMS (if indicated):    Assets:  Communication Skills  ADL's:  Intact  Cognition: WNL  Sleep:        Treatment Plan Summary:Alexxus Sobh I Paullette Mckain, MD 12/29/20204:12 PM    At this time the patient will continue taking Xanax on a as needed basis.  His diagnosis would be: Adjustment disorder with an anxious mood state.  His specific stressor is not clear but is likely related to the pandemic and related to work.  I think he is functioning very well.  He takes a very small dose of Xanax.  He is no evidence of misuse or abuse of his Xanax or of any substance.  He will return to see me in 5 months.

## 2020-03-06 ENCOUNTER — Other Ambulatory Visit: Payer: Self-pay

## 2020-03-06 ENCOUNTER — Ambulatory Visit (HOSPITAL_COMMUNITY): Payer: 59 | Admitting: Psychiatry

## 2020-03-18 ENCOUNTER — Encounter (HOSPITAL_COMMUNITY): Payer: Self-pay

## 2020-03-18 ENCOUNTER — Other Ambulatory Visit: Payer: Self-pay

## 2020-03-18 ENCOUNTER — Emergency Department (HOSPITAL_COMMUNITY)
Admission: EM | Admit: 2020-03-18 | Discharge: 2020-03-18 | Disposition: A | Discharge status: Home / Self Care | Payer: 59 | Attending: Emergency Medicine | Admitting: Emergency Medicine

## 2020-03-18 ENCOUNTER — Emergency Department (HOSPITAL_COMMUNITY): Payer: 59

## 2020-03-18 DIAGNOSIS — R1031 Right lower quadrant pain: Secondary | ICD-10-CM | POA: Insufficient documentation

## 2020-03-18 DIAGNOSIS — Z79899 Other long term (current) drug therapy: Secondary | ICD-10-CM | POA: Diagnosis not present

## 2020-03-18 DIAGNOSIS — I10 Essential (primary) hypertension: Secondary | ICD-10-CM | POA: Diagnosis not present

## 2020-03-18 LAB — URINALYSIS, ROUTINE W REFLEX MICROSCOPIC
Bilirubin Urine: NEGATIVE
Glucose, UA: NEGATIVE mg/dL
Ketones, ur: NEGATIVE mg/dL
Nitrite: NEGATIVE
Protein, ur: 30 mg/dL — AB
RBC / HPF: >50 RBC/hpf — ABNORMAL HIGH (ref 0–5)
Specific Gravity, Urine: 1.011 (ref 1.005–1.030)
pH: 6 (ref 5.0–8.0)

## 2020-03-18 LAB — COMPREHENSIVE METABOLIC PANEL
ALT: 20 U/L (ref 0–44)
AST: 19 U/L (ref 15–41)
Albumin: 4.1 g/dL (ref 3.5–5.0)
Alkaline Phosphatase: 92 U/L (ref 38–126)
Anion gap: 10 (ref 5–15)
BUN: 20 mg/dL (ref 8–23)
CO2: 24 mmol/L (ref 22–32)
Calcium: 8.7 mg/dL — ABNORMAL LOW (ref 8.9–10.3)
Chloride: 104 mmol/L (ref 98–111)
Creatinine, Ser: 1.32 mg/dL — ABNORMAL HIGH (ref 0.61–1.24)
GFR calc Af Amer: >60 mL/min (ref >60)
GFR calc non Af Amer: 57 mL/min — ABNORMAL LOW (ref >60)
Glucose, Bld: 112 mg/dL — ABNORMAL HIGH (ref 70–99)
Potassium: 3.9 mmol/L (ref 3.5–5.1)
Sodium: 138 mmol/L (ref 135–145)
Total Bilirubin: 1.2 mg/dL (ref 0.3–1.2)
Total Protein: 7.2 g/dL (ref 6.5–8.1)

## 2020-03-18 LAB — CBC WITH DIFFERENTIAL/PLATELET
Abs Immature Granulocytes: 0.06 10*3/uL (ref 0.00–0.07)
Basophils Absolute: 0 10*3/uL (ref 0.0–0.1)
Basophils Relative: 0 %
Eosinophils Absolute: 0 10*3/uL (ref 0.0–0.5)
Eosinophils Relative: 0 %
HCT: 43.8 % (ref 39.0–52.0)
Hemoglobin: 15 g/dL (ref 13.0–17.0)
Immature Granulocytes: 0 %
Lymphocytes Relative: 6 %
Lymphs Abs: 0.8 10*3/uL (ref 0.7–4.0)
MCH: 30.7 pg (ref 26.0–34.0)
MCHC: 34.2 g/dL (ref 30.0–36.0)
MCV: 89.6 fL (ref 80.0–100.0)
Monocytes Absolute: 0.8 10*3/uL (ref 0.1–1.0)
Monocytes Relative: 6 %
Neutro Abs: 11.7 10*3/uL — ABNORMAL HIGH (ref 1.7–7.7)
Neutrophils Relative %: 88 %
Platelets: 226 10*3/uL (ref 150–400)
RBC: 4.89 MIL/uL (ref 4.22–5.81)
RDW: 13 % (ref 11.5–15.5)
WBC: 13.4 10*3/uL — ABNORMAL HIGH (ref 4.0–10.5)
nRBC: 0 % (ref 0.0–0.2)

## 2020-03-18 LAB — LIPASE, BLOOD: Lipase: 25 U/L (ref 11–51)

## 2020-03-18 LAB — LACTIC ACID, PLASMA: Lactic Acid, Venous: 1.2 mmol/L (ref 0.5–1.9)

## 2020-03-18 MED ORDER — OXYCODONE-ACETAMINOPHEN 5-325 MG PO TABS: 1.0000 | ORAL_TABLET | ORAL | 0 refills | Status: DC | PRN

## 2020-03-18 MED ORDER — IOHEXOL 300 MG/ML  SOLN: 100.0000 mL | Freq: Once | INTRAMUSCULAR | Status: DC | PRN

## 2020-03-18 MED ORDER — SODIUM CHLORIDE (PF) 0.9 % IJ SOLN
INTRAMUSCULAR | Status: AC
  Filled 2020-03-18: qty 50

## 2020-03-18 NOTE — Discharge Instructions (Signed)
Your history and exam are concerning for either kidney stones on prior, diverticulitis, or appendicitis.  As you are feeling better and your labs only show the elevated white blood cell count and did not show convincing evidence of UTI, we together had a shared decision-making conversation and agreed to hold on imaging and let you go home to follow-up with urology.  If your symptoms change or worsen, please consider returning to the nearest emergency department for further management.  Please follow-up with urology in the neck several days if possible.

## 2020-03-18 NOTE — ED Provider Notes (Addendum)
[facility]-EMERGENCY DEPT Provider Note   CSN: [id number] Arrival date & time: 03/18/20  1001     History Chief Complaint  Patient presents with  . Abdominal Pain    Dale Hawkins is a 64 y.o. male.  The history is provided by the patient and medical records. No language interpreter was used.  Abdominal Pain Pain location:  RLQ Pain quality: aching, sharp and stabbing   Pain radiates to:  Does not radiate Pain severity:  Moderate Onset quality:  Sudden Duration:  7 hours Timing:  Constant Progression:  Improving Chronicity:  New Context: not previous surgeries and not trauma   Relieved by:  Nothing Worsened by:  Nothing Associated symptoms: no chest pain, no chills, no constipation, no cough, no diarrhea, no dysuria, no fatigue, no fever, no nausea, no shortness of breath and no vomiting   Risk factors: no recent hospitalization        Past Medical History:  Diagnosis Date  . Anxiety   . Depression   . Diverticulitis   . Kidney stone     There are no problems to display for this patient.   Past Surgical History:  Procedure Laterality Date  . CYST REMOVAL TRUNK    . NECK SURGERY  2004       Family History  Problem Relation Age of Onset  . ADD / ADHD Son   . ADD / ADHD Son   . Cancer Mother     Social History   Tobacco Use  . Smoking status: Never Smoker  . Smokeless tobacco: Never Used  Vaping Use  . Vaping Use: Never used  Substance Use Topics  . Alcohol use: No  . Drug use: No    Home Medications Prior to Admission medications   Medication Sig Start Date End Date Taking? Authorizing Provider  ALPRAZolam Prudy Feeler) 0.25 MG tablet 1  qhs as needed 10/03/19   Plovsky, Earvin Hansen, MD  amLODipine (NORVASC) 5 MG tablet Take 5 mg by mouth daily. 04/11/19   [provider]  ibuprofen (ADVIL,MOTRIN) 800 MG tablet Take 1 tablet (800 mg total) by mouth 3 (three) times daily. 02/01/17   Glynn Octave, MD  PRESCRIPTION  MEDICATION Apply 1 application topically daily as needed (rosacea).     [provider]    Allergies    Patient has no known allergies.  Review of Systems   Review of Systems  Constitutional: Negative for chills, diaphoresis, fatigue and fever.  HENT: Negative for congestion.   Eyes: Negative for visual disturbance.  Respiratory: Negative for cough, chest tightness and shortness of breath.   Cardiovascular: Negative for chest pain.  Gastrointestinal: Positive for abdominal pain. Negative for abdominal distention, constipation, diarrhea, nausea and vomiting.  Genitourinary: Negative for difficulty urinating, dysuria, flank pain, frequency, genital sores, penile pain, penile swelling, scrotal swelling, testicular pain and urgency.  Musculoskeletal: Negative for back pain, neck pain and neck stiffness.  Skin: Negative for rash and wound.  Neurological: Negative for light-headedness and headaches.  Psychiatric/Behavioral: Negative for agitation.  All other systems reviewed and are negative.   Physical Exam Updated Vital Signs BP (!) 169/103 (BP Location: Left Arm)   Pulse 64   Temp 98.1 F (36.7 C) (Oral)   Resp 18   Ht 5\' 10"  (1.778 m)   Wt 86.2 kg   SpO2 100%   BMI 27.26 kg/m   Physical Exam Vitals and nursing note reviewed.  Constitutional:      General: He is not  in acute distress.    Appearance: He is well-developed. He is not ill-appearing, toxic-appearing or diaphoretic.  HENT:     Head: Normocephalic and atraumatic.  Eyes:     Conjunctiva/sclera: Conjunctivae normal.  Cardiovascular:     Rate and Rhythm: Normal rate and regular rhythm.     Heart sounds: Normal heart sounds. No murmur heard.   Pulmonary:     Effort: Pulmonary effort is normal. No respiratory distress.     Breath sounds: Normal breath sounds. No wheezing, rhonchi or rales.  Chest:     Chest wall: No tenderness.  Abdominal:     General: Abdomen is flat. Bowel sounds are normal. There is  no distension.     Palpations: Abdomen is soft.     Tenderness: There is abdominal tenderness in the right lower quadrant. There is no right CVA tenderness, left CVA tenderness, guarding or rebound.    Musculoskeletal:     Cervical back: Neck supple.  Skin:    General: Skin is warm and dry.     Capillary Refill: Capillary refill takes less than 2 seconds.     Coloration: Skin is not pale.     Findings: No erythema.  Neurological:     General: No focal deficit present.     Mental Status: He is alert.  Psychiatric:        Mood and Affect: Mood normal.     ED Results / Procedures / Treatments   Labs (all labs ordered are listed, but only abnormal results are displayed) Labs Reviewed  URINALYSIS, ROUTINE W REFLEX MICROSCOPIC - Abnormal; Notable for the following components:      Result Value   Hgb urine dipstick LARGE (*)    Protein, ur 30 (*)    Leukocytes,Ua TRACE (*)    RBC / HPF >50 (*)    Bacteria, UA RARE (*)    All other components within normal limits  CBC WITH DIFFERENTIAL/PLATELET - Abnormal; Notable for the following components:   WBC 13.4 (*)    Neutro Abs 11.7 (*)    All other components within normal limits  COMPREHENSIVE METABOLIC PANEL - Abnormal; Notable for the following components:   Glucose, Bld 112 (*)    Creatinine, Ser 1.32 (*)    Calcium 8.7 (*)    GFR calc non Af Amer 57 (*)    All other components within normal limits  URINE CULTURE  LIPASE, BLOOD  LACTIC ACID, PLASMA  LACTIC ACID, PLASMA    EKG None  Radiology No results found.  Procedures Procedures (including critical care time)  Medications Ordered in ED Medications  iohexol (OMNIPAQUE) 300 MG/ML solution 100 mL (has no administration in time range)  sodium chloride (PF) 0.9 % injection (has no administration in time range)    ED Course  I have reviewed the triage vital signs and the nursing notes.  Pertinent labs & imaging results that were available during my care of the  patient were reviewed by me and considered in my medical decision making (see chart for details).    MDM Rules/Calculators/A&P                          Dale Hawkins is a 65 y.o. male with a past medical history significant for kidney stones, diverticulitis, anxiety, depression who presents with right lower quadrant abdominal pain.  Patient reports that he is unsure he feels more similar to diverticulitis or kidney stone in the past.  He says that his last kidney stone was back in February and he was being managed by [facility].  He says that the pain started this morning around 4 AM and is in his right lower quadrant.  He reports it is a sharp and aching pain.  He does say that this is the location of his prior diverticulitis.  He still has his appendix.  He reports no significant pain in his right flank or back.  He denies any nausea, vomiting, fevers, chills, constipation, diarrhea, or urinary symptoms.  He denies any foul smell of his urine or darkened urine.  He says that he is withdrawing to drink water since his kidney stones and thinks he is doing a good job with it.  He does report 3 weeks ago, he had some right lower quadrant abdominal pain and his PCP gave him antibiotics for possible early diverticulitis.  He reports that he complete the antibiotics and the symptoms improved until today.  On exam, patient does have tenderness in his right lower quadrant.  Lungs clear flanks nontender.  No CVA tenderness.  Chest nontender.  Normal bowel sounds.  Patient resting comfortably.  Clinically I suspect either an atypical right-sided diverticulitis versus kidney stone primarily however appendicitis is also considered given location of discomfort.  Given this possible diverticulitis several weeks ago and his recurrence and pain, I do feel that a CT is warranted to make sure he did not have a partially treated diverticulitis that has now developed abscess or appendicitis.  Will get CT scan with  contrast as well as a urinalysis and other labs.  He does not want pain medicine at this time but his pain went from an 8 out of 10 to a 4 out of 10 currently.  Anticipate reassessment after work-up.      12:29 PM Patient's labs returned showing leukocytosis but otherwise there are no nitrites.  There are some leukocytes and bacteria and culture will be sent.  There is hemoglobin in the urine and red blood cells which likely is consistent with kidney stone.  As patient was getting ready to be taken for CT scan, he reports he does not want the imaging right now as he is feeling much better.  We discussed the labs and that there could be a possibility of smoldering diverticulitis, appendicitis or abscess but he was told on CT imaging.  He would like to follow-up with his urologist and return if any symptoms change or worsen.  This was felt is reasonable.  He will be given a prescription for several dose of pain medication and will follow up as directed.  Understand strict return precautions and will return.  Patient discharged in good condition.    Final Clinical Impression(s) / ED Diagnoses Final diagnoses:  RLQ abdominal pain    Rx / DC Orders ED Discharge Orders         Ordered    oxyCODONE-acetaminophen (PERCOCET/ROXICET) 5-325 MG tablet  Every 4 hours PRN     Discontinue  Reprint     03/18/20 1234          Clinical Impression: 1. RLQ abdominal pain     Disposition: Discharge  Condition: Good  I have discussed the results, Dx and Tx plan with the pt(& family if present). He/she/they expressed understanding and agree(s) with the plan. Discharge instructions discussed at great length. Strict return precautions discussed and pt &/or family have verbalized understanding of the instructions. No further questions at time of discharge.  Discharge Medication List as of 03/18/2020 12:36 PM    START taking these medications   Details  oxyCODONE-acetaminophen (PERCOCET/ROXICET) 5-325  MG tablet Take 1 tablet by mouth every 4 (four) hours as needed for severe pain., Starting Mon 03/18/2020, Normal        Follow Up: Tally Joe, MD [address] [place] [address] [phone]     [facility] [address] [place] [address] [place] [address] [phone]    [facility] [address] [id number] mc [address] [place] [address] [phone]           Jalon Squier, Canary Brim, MD 03/18/20 1504

## 2020-03-18 NOTE — ED Notes (Signed)
ED Provider at bedside. 

## 2020-03-18 NOTE — ED Triage Notes (Signed)
Patient c/o RLQ pain that radiatesi nto the right lower abdomen since 0400. Patient states it feels like the pain he has had with kidney stones.

## 2020-03-19 LAB — URINE CULTURE: Culture: NO GROWTH

## 2020-03-26 ENCOUNTER — Other Ambulatory Visit: Payer: Self-pay

## 2020-03-26 ENCOUNTER — Telehealth (INDEPENDENT_AMBULATORY_CARE_PROVIDER_SITE_OTHER): Payer: 59 | Admitting: Psychiatry

## 2020-03-26 DIAGNOSIS — F4323 Adjustment disorder with mixed anxiety and depressed mood: Secondary | ICD-10-CM | POA: Diagnosis not present

## 2020-03-26 MED ORDER — ALPRAZOLAM 0.25 MG PO TABS: ORAL_TABLET | ORAL | 1 refills | Status: DC

## 2020-03-26 NOTE — Progress Notes (Signed)
Psychiatric Initial Adult Assessment   Patient Identification: Dale Hawkins MRN:  [id number] Date of Evaluation:  03/26/2020 Referral Source Dr. Dyane Dustman    Visit Diagnosis: Adjustment disorder with an anxious mood state  Today the patient is doing very well.  He is hardly taking any Xanax at all.  He is on no psychotropics.  His anxiety is well controlled.  He recently did have some kidney stones but other than that he is medically very stable.  He denies daily depression.  He denies use of alcohol or drugs.  He had transient episode to get intensely anxious and had paralyzing headaches.  Fortunately this seems to be rare at this time.  To have Xanax available to take a tiny dose once every few months seems to serve his needs.  He never misuses this medicine at all.  He is positive and optimistic.  He has no evidence of psychosis.  He is functioning extremely well.  The pandemic is not bothering him.  He continues to work full-time.  His family as well.  History of Present Illness:    Associated Signs/Symptoms: Depression Symptoms:  anxiety, (Hypo) Manic Symptoms:   Anxiety Symptoms:   Psychotic Symptoms:   PTSD Symptoms:   Past Psychiatric History:   Previous Psychotropic Medications: Yes   Substance Abuse History in the last 12 months:  No.  Consequences of Substance Abuse:   Past Medical History:  Past Medical History:  Diagnosis Date  . Anxiety   . Depression   . Diverticulitis   . Kidney stone     Past Surgical History:  Procedure Laterality Date  . CYST REMOVAL TRUNK    . NECK SURGERY  2004    Family Psychiatric History:   Family History:  Family History  Problem Relation Age of Onset  . ADD / ADHD Son   . ADD / ADHD Son   . Cancer Mother     Social History:   Social History   Socioeconomic History  . Marital status: Married    Spouse name: Not on file  . Number of children: Not on file  . Years of education: Not on file  . Highest  education level: Not on file  Occupational History  . Not on file  Tobacco Use  . Smoking status: Never Smoker  . Smokeless tobacco: Never Used  Vaping Use  . Vaping Use: Never used  Substance and Sexual Activity  . Alcohol use: No  . Drug use: No  . Sexual activity: Yes    Partners: Female    Birth control/protection: None  Other Topics Concern  . Not on file  Social History Narrative  . Not on file   Social Determinants of Health   Financial Resource Strain:   . Difficulty of Paying Living Expenses:   Food Insecurity:   . Worried About Programme researcher, broadcasting/film/video in the Last Year:   . Barista in the Last Year:   Transportation Needs:   . Freight forwarder (Medical):   Marland Kitchen Lack of Transportation (Non-Medical):   Physical Activity:   . Days of Exercise per Week:   . Minutes of Exercise per Session:   Stress:   . Feeling of Stress :   Social Connections:   . Frequency of Communication with Friends and Family:   . Frequency of Social Gatherings with Friends and Family:   . Attends Religious Services:   . Active Member of Clubs or Organizations:   . Attends  Club or Organization Meetings:   Marland Kitchen Marital Status:     Additional Social History:   Allergies:  No Known Allergies  Metabolic Disorder Labs: No results found for: HGBA1C, MPG No results found for: PROLACTIN No results found for: CHOL, TRIG, HDL, CHOLHDL, VLDL, LDLCALC   Current Medications: Current Outpatient Medications  Medication Sig Dispense Refill  . ALPRAZolam (XANAX) 0.25 MG tablet 1  qhs as needed 20 tablet 1  . amLODipine (NORVASC) 5 MG tablet Take 5 mg by mouth daily.    Marland Kitchen ibuprofen (ADVIL,MOTRIN) 800 MG tablet Take 1 tablet (800 mg total) by mouth 3 (three) times daily. 21 tablet 0  . oxyCODONE-acetaminophen (PERCOCET/ROXICET) 5-325 MG tablet Take 1 tablet by mouth every 4 (four) hours as needed for severe pain. 6 tablet 0  . PRESCRIPTION MEDICATION Apply 1 application topically daily as  needed (rosacea).      No current facility-administered medications for this visit.    Neurologic: Headache: Yes Seizure: No Paresthesias:No  Musculoskeletal: Strength & Muscle Tone: within normal limits Gait & Station: normal Patient leans: N/A  Psychiatric Specialty Exam: ROS  There were no vitals taken for this visit.There is no height or weight on file to calculate BMI.  General Appearance: Meticulous  Eye Contact:  Good  Speech:  Clear and Coherent  Volume:  Normal  Mood:  Euthymic  Affect:  Congruent  Thought Process:  Goal Directed  Orientation:  Full (Time, Place, and Person)  Thought Content:  WDL  Suicidal Thoughts:  No  Homicidal Thoughts:  No  Memory:  NA  Judgement:  Good  Insight:  Good  Psychomotor Activity:  Normal  Concentration:    Recall:  Good  Fund of Knowledge:Good  Language: Good  Akathisia:  No  Handed:  Right  AIMS (if indicated):    Assets:  Communication Skills  ADL's:  Intact  Cognition: WNL  Sleep:        Treatment Plan Summary:Idelia Caudell I Latisa Belay, MD 6/22/20214:32 PM    Adjustment disorder with an anxious mood state.  At this time his anxiety seems to be well controlled.  We will still have a Xanax 0.25 mg half of a pill available on a as needed basis.  I suspect he will need to use it more than once or twice in 6 months.  The patient was given a return appointment in 10 months and if he feels well then as he is now we will probably add to her care.  I think psychologically know that I am available and that he has some Xanax for emergencies is very helpful for him.

## 2020-07-29 ENCOUNTER — Other Ambulatory Visit (HOSPITAL_COMMUNITY): Payer: Self-pay | Admitting: *Deleted

## 2020-07-29 MED ORDER — ALPRAZOLAM 0.25 MG PO TABS: ORAL_TABLET | ORAL | 1 refills | Status: DC

## 2020-11-21 ENCOUNTER — Other Ambulatory Visit (HOSPITAL_COMMUNITY): Payer: Self-pay | Admitting: *Deleted

## 2020-11-21 MED ORDER — ALPRAZOLAM 0.25 MG PO TABS: ORAL_TABLET | ORAL | 1 refills | Status: DC

## 2020-12-02 ENCOUNTER — Ambulatory Visit
Admission: RE | Admit: 2020-12-02 | Discharge: 2020-12-02 | Disposition: A | Discharge status: Home / Self Care | Payer: 59 | Source: Ambulatory Visit | Attending: Family Medicine | Admitting: Family Medicine

## 2020-12-02 ENCOUNTER — Other Ambulatory Visit: Payer: Self-pay | Admitting: Family Medicine

## 2020-12-02 DIAGNOSIS — R059 Cough, unspecified: Secondary | ICD-10-CM

## 2020-12-10 ENCOUNTER — Other Ambulatory Visit (HOSPITAL_COMMUNITY): Payer: Self-pay

## 2020-12-10 MED ORDER — ALPRAZOLAM 0.25 MG PO TABS: ORAL_TABLET | ORAL | 1 refills | Status: DC

## 2021-01-22 ENCOUNTER — Other Ambulatory Visit: Payer: Self-pay

## 2021-01-22 ENCOUNTER — Telehealth (INDEPENDENT_AMBULATORY_CARE_PROVIDER_SITE_OTHER): Payer: 59 | Admitting: Psychiatry

## 2021-01-22 DIAGNOSIS — F4323 Adjustment disorder with mixed anxiety and depressed mood: Secondary | ICD-10-CM | POA: Diagnosis not present

## 2021-01-22 MED ORDER — ALPRAZOLAM 0.25 MG PO TABS: ORAL_TABLET | ORAL | 4 refills | Status: DC

## 2021-01-22 NOTE — Progress Notes (Signed)
Psychiatric Initial Adult Assessment   Patient Identification: Dale Hawkins MRN:  [id number] Date of Evaluation:  01/22/2021 Referral Source Dr. Dyane Dustman    Visit Diagnosis: Adjustment disorder with an anxious mood state  Today the patient is doing fairly well.  He is having some issues with back problems and he did see his Careers adviser.  He is due to go to a chiropractor first.  Work is quite hectic but he seems to be handling it well.  He takes Xanax 0.25 mg on a as needed basis.  Most of time he actually takes half of a Xanax and therefore takes 0.125.  Serzone very low-dose of Xanax he never abuses it.  He drinks no alcohol and uses no drugs.  He denies daily depression.  His anxiety is reasonably well controlled.  He is clearly situational.  He is functioning very well.  He is positive and optimistic.  He has lots of energy.  His family life is good.  Medically he is stable other than some back problems.  Xanax on a as needed basis helps him a lot. History of Present Illness:    Associated Signs/Symptoms: Depression Symptoms:  anxiety, (Hypo) Manic Symptoms:   Anxiety Symptoms:   Psychotic Symptoms:   PTSD Symptoms:   Past Psychiatric History:   Previous Psychotropic Medications: Yes   Substance Abuse History in the last 12 months:  No.  Consequences of Substance Abuse:   Past Medical History:  Past Medical History:  Diagnosis Date  . Anxiety   . Depression   . Diverticulitis   . Kidney stone     Past Surgical History:  Procedure Laterality Date  . CYST REMOVAL TRUNK    . NECK SURGERY  2004    Family Psychiatric History:   Family History:  Family History  Problem Relation Age of Onset  . ADD / ADHD Son   . ADD / ADHD Son   . Cancer Mother     Social History:   Social History   Socioeconomic History  . Marital status: Married    Spouse name: Not on file  . Number of children: Not on file  . Years of education: Not on file  . Highest education level:  Not on file  Occupational History  . Not on file  Tobacco Use  . Smoking status: Never Smoker  . Smokeless tobacco: Never Used  Vaping Use  . Vaping Use: Never used  Substance and Sexual Activity  . Alcohol use: No  . Drug use: No  . Sexual activity: Yes    Partners: Female    Birth control/protection: None  Other Topics Concern  . Not on file  Social History Narrative  . Not on file   Social Determinants of Health   Financial Resource Strain: Not on file  Food Insecurity: Not on file  Transportation Needs: Not on file  Physical Activity: Not on file  Stress: Not on file  Social Connections: Not on file    Additional Social History:   Allergies:  No Known Allergies  Metabolic Disorder Labs: No results found for: HGBA1C, MPG No results found for: PROLACTIN No results found for: CHOL, TRIG, HDL, CHOLHDL, VLDL, LDLCALC   Current Medications: Current Outpatient Medications  Medication Sig Dispense Refill  . ALPRAZolam (XANAX) 0.25 MG tablet 1  qhs as needed 25 tablet 4  . amLODipine (NORVASC) 5 MG tablet Take 5 mg by mouth daily.    Marland Kitchen ibuprofen (ADVIL,MOTRIN) 800 MG tablet Take 1 tablet (  800 mg total) by mouth 3 (three) times daily. 21 tablet 0  . oxyCODONE-acetaminophen (PERCOCET/ROXICET) 5-325 MG tablet Take 1 tablet by mouth every 4 (four) hours as needed for severe pain. 6 tablet 0  . PRESCRIPTION MEDICATION Apply 1 application topically daily as needed (rosacea).      No current facility-administered medications for this visit.    Neurologic: Headache: Yes Seizure: No Paresthesias:No  Musculoskeletal: Strength & Muscle Tone: within normal limits Gait & Station: normal Patient leans: N/A  Psychiatric Specialty Exam: ROS  There were no vitals taken for this visit.There is no height or weight on file to calculate BMI.  General Appearance: Meticulous  Eye Contact:  Good  Speech:  Clear and Coherent  Volume:  Normal  Mood:  Euthymic  Affect:  Congruent   Thought Process:  Goal Directed  Orientation:  Full (Time, Place, and Person)  Thought Content:  WDL  Suicidal Thoughts:  No  Homicidal Thoughts:  No  Memory:  NA  Judgement:  Good  Insight:  Good  Psychomotor Activity:  Normal  Concentration:    Recall:  Good  Fund of Knowledge:Good  Language: Good  Akathisia:  No  Handed:  Right  AIMS (if indicated):    Assets:  Communication Skills  ADL's:  Intact  Cognition: WNL  Sleep:        Treatment Plan Summary:Dale Gironda I Anton Cheramie, MD 4/20/20222:26 PM    This patient's diagnosis is adjustment disorder with an anxious mood state.  He is pretty much resolved and needs to take Xanax only on a as needed basis.  Pranau helps him a great deal and he takes a very small dose appropriately.  The patient will continue seeing me and come back to see me in 10 months.

## 2021-04-29 IMAGING — CR DG CHEST 2V
2 series · 2 of 2 positions shown · non-contrast
Comparison: Radiograph 08/29/2016

CLINICAL DATA: Cough.  Patient reports prolonged productive cough.

EXAM:
CHEST - 2 VIEW

[w chest pa]
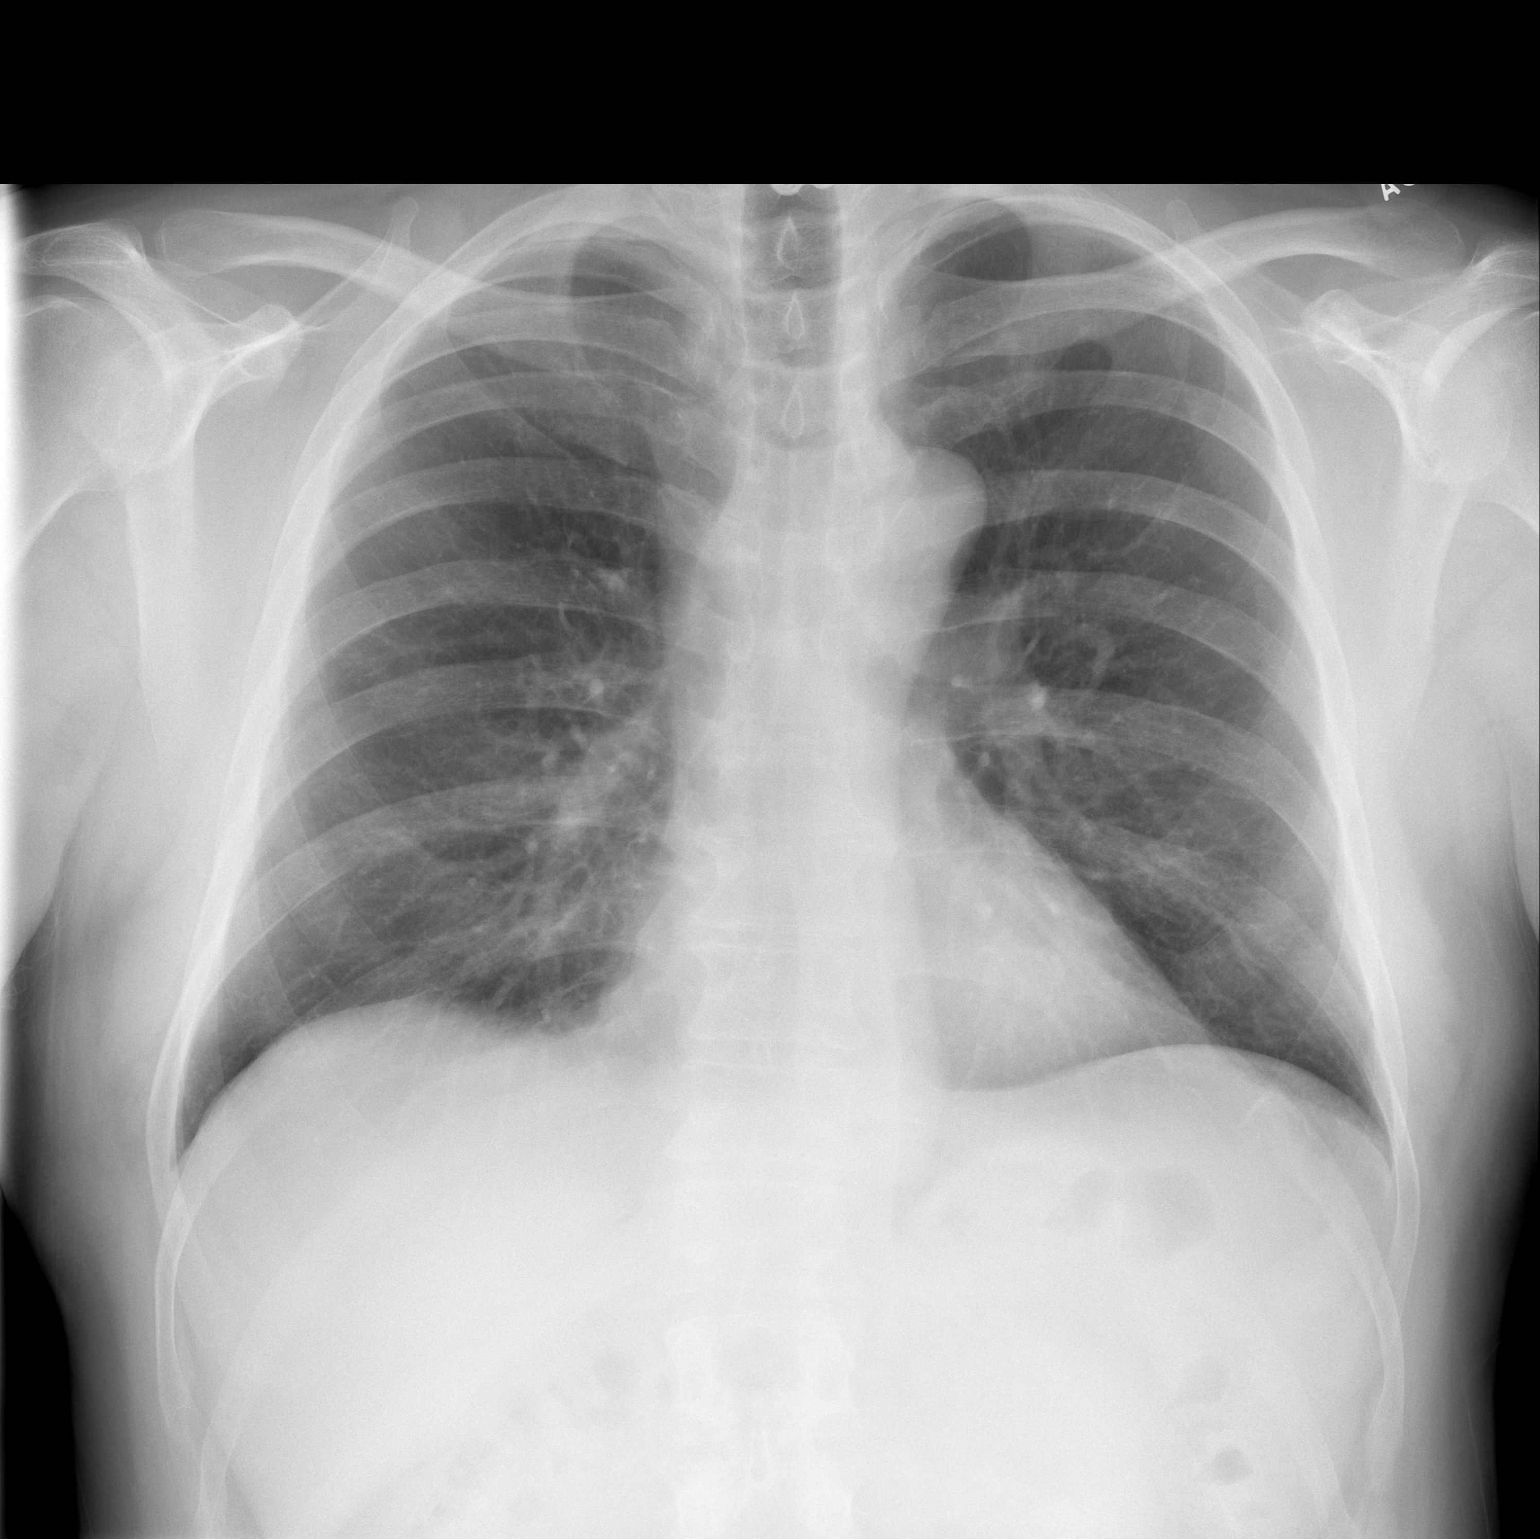

[w chest lat]
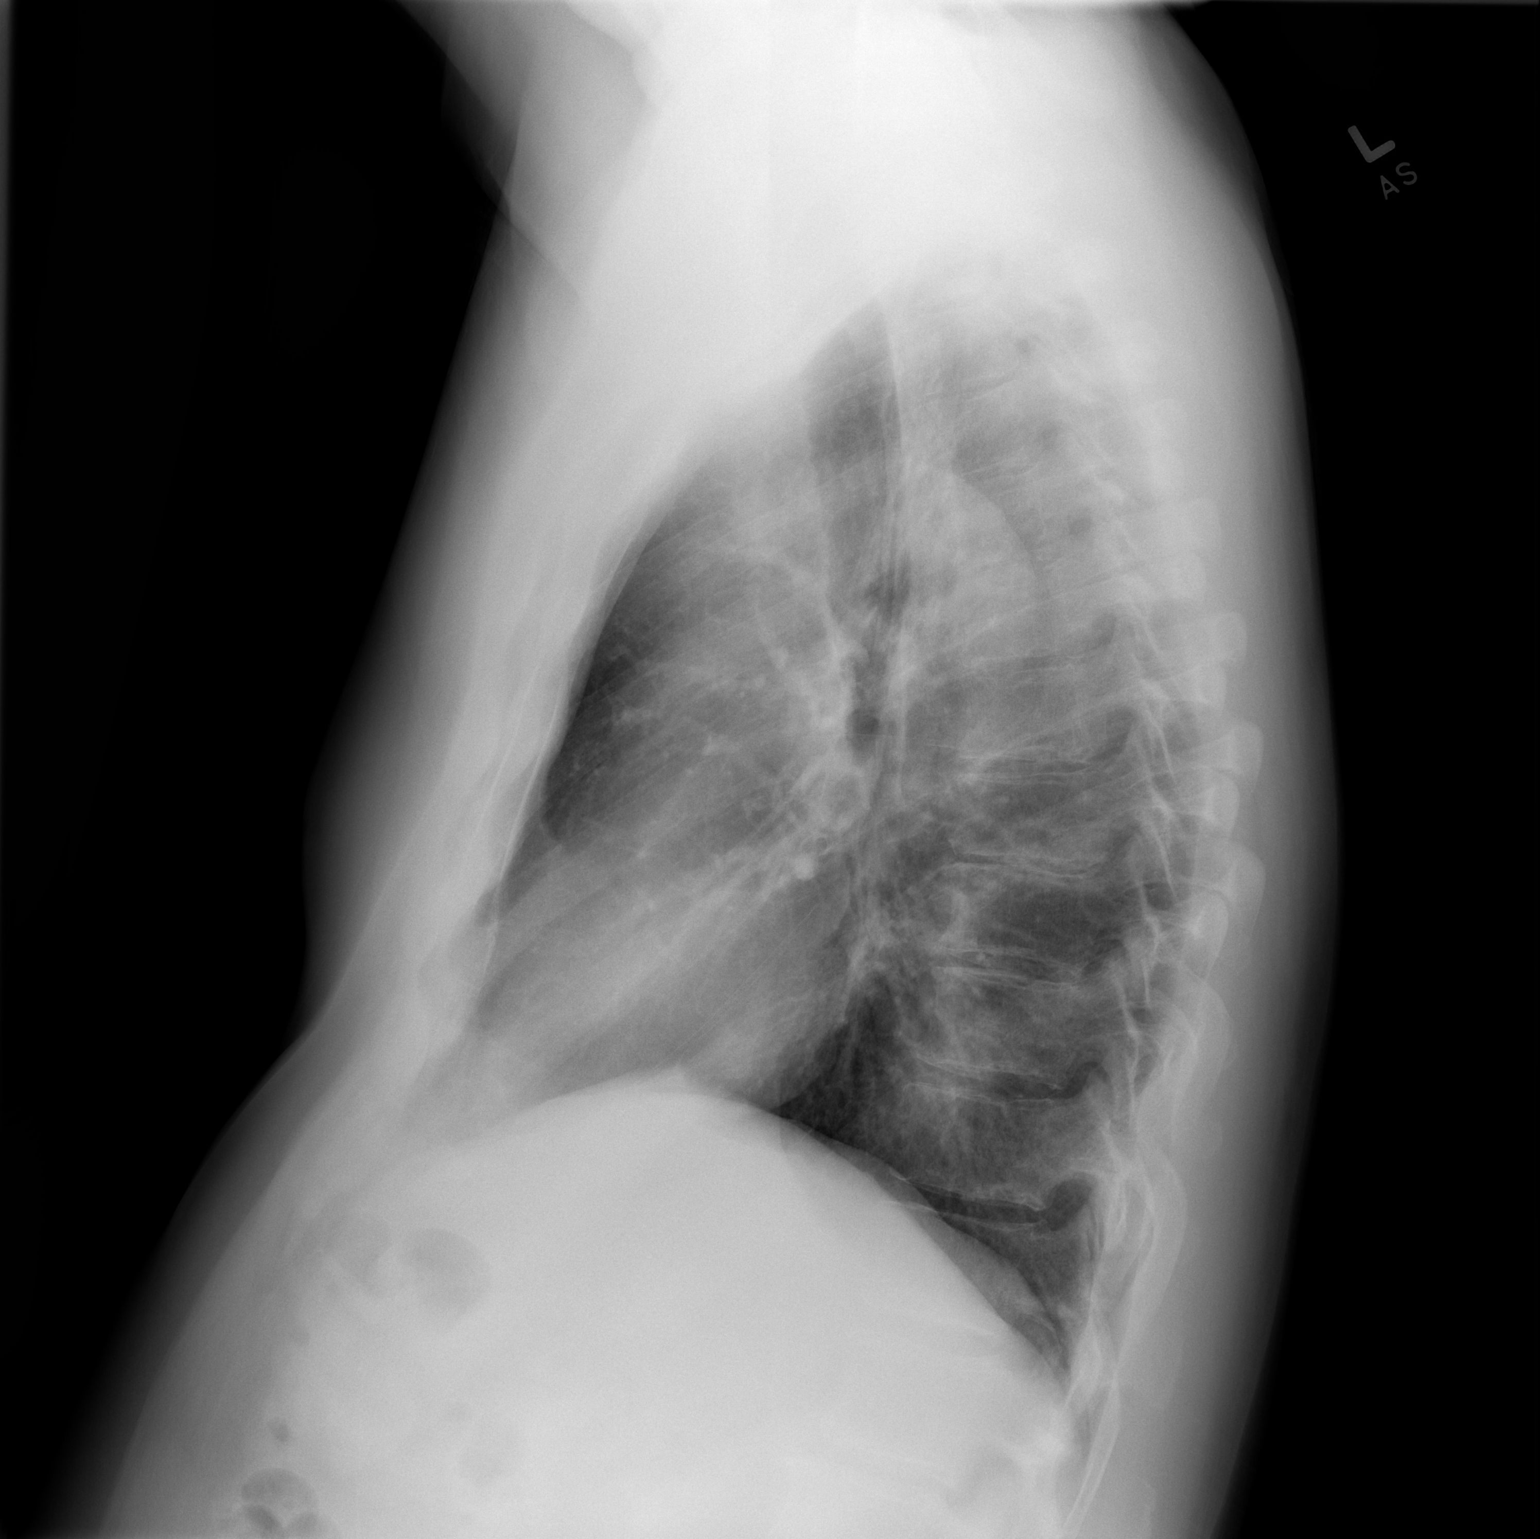

[2 of 2 positions shown; findings below may reference images not displayed]

FINDINGS: The cardiomediastinal contours are normal. The lungs are clear.
Pulmonary vasculature is normal. No consolidation, pleural effusion,
or pneumothorax. Surgical hardware in the lower cervical spine. No
acute osseous abnormalities are seen.
IMPRESSION: No acute chest findings or explanation for cough.

## 2021-08-18 ENCOUNTER — Other Ambulatory Visit (HOSPITAL_COMMUNITY): Payer: Self-pay | Admitting: *Deleted

## 2021-08-18 MED ORDER — ALPRAZOLAM 0.25 MG PO TABS: ORAL_TABLET | ORAL | 2 refills | Status: DC

## 2021-11-26 ENCOUNTER — Telehealth (HOSPITAL_BASED_OUTPATIENT_CLINIC_OR_DEPARTMENT_OTHER): Payer: 59 | Admitting: Psychiatry

## 2021-11-26 ENCOUNTER — Other Ambulatory Visit: Payer: Self-pay

## 2021-11-26 DIAGNOSIS — F4323 Adjustment disorder with mixed anxiety and depressed mood: Secondary | ICD-10-CM

## 2021-11-26 MED ORDER — ALPRAZOLAM 0.25 MG PO TABS: ORAL_TABLET | ORAL | 3 refills | Status: DC

## 2021-11-26 MED ORDER — ALPRAZOLAM 0.25 MG PO TABS: ORAL_TABLET | ORAL | 2 refills | Status: DC

## 2021-11-26 NOTE — Progress Notes (Addendum)
Psychiatric Initial Adult Assessment   Patient Identification: Dale Hawkins MRN:  [id number] Date of Evaluation:  11/26/2021 Referral Source Dr. Dyane Dustman     Today the patient is doing well.  His anxiety is relatively well controlled.  He takes a half of a Xanax 0.25 mg perhaps once a week.  He uses it very sparingly.  He drinks no alcohol and uses no drugs.  His back seems to be doing better.  His biggest issue is work stress.  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  His work is hectic.  The use of a tiny dose of Xanax now and then is very helpful for him.  He only takes it when he is really overwhelmed.  He is eating well has a good mood state his family is stable.  He has 2 grandchildren who are doing well.  Overall his health is good and financially because financially he is very stable. Visit Diagnosis: Adjustment disorder with an anxious mood state History of Present Illness:    Associated Signs/Symptoms: Depression Symptoms:  anxiety, (Hypo) Manic Symptoms:   Anxiety Symptoms:   Psychotic Symptoms:   PTSD Symptoms:   Past Psychiatric History:   Previous Psychotropic Medications: Yes   Substance Abuse History in the last 12 months:  No.  Consequences of Substance Abuse:   Past Medical History:  Past Medical History:  Diagnosis Date   Anxiety    Depression    Diverticulitis    Kidney stone     Past Surgical History:  Procedure Laterality Date   CYST REMOVAL TRUNK     NECK SURGERY  2004    Family Psychiatric History:   Family History:  Family History  Problem Relation Age of Onset   ADD / ADHD Son    ADD / ADHD Son    Cancer Mother     Social History:   Social History   Socioeconomic History   Marital status: Married    Spouse name: Not on file   Number of children: Not on file   Years of education: Not on file   Highest education level: Not on file  Occupational History   Not on file  Tobacco Use   Smoking status: Never    Smokeless tobacco: Never  Vaping Use   Vaping Use: Never used  Substance and Sexual Activity   Alcohol use: No   Drug use: No   Sexual activity: Yes    Partners: Female    Birth control/protection: None  Other Topics Concern   Not on file  Social History Narrative   Not on file   Social Determinants of Health   Financial Resource Strain: Not on file  Food Insecurity: Not on file  Transportation Needs: Not on file  Physical Activity: Not on file  Stress: Not on file  Social Connections: Not on file   Virtual Visit via Telephone Note  I connected with Dale Hawkins on 06/22/23 at  1:00 PM EST by telephone and verified that I am speaking with the correct person using two identifiers.  Location: Patient: home Provider: office   I discussed the limitations, risks, security and privacy concerns of performing an evaluation and management service by telephone and the availability of in person appointments. I also discussed with the patient that there may be a patient responsible charge related to this service. The patient expressed understanding and agreed to proceed.    I discussed the assessment and treatment plan with the patient.  The patient was provided an opportunity to ask questions and all were answered. The patient agreed with the plan and demonstrated an understanding of the instructions.   The patient was advised to call back or seek an in-person evaluation if the symptoms worsen or if the condition fails to improve as anticipated.  I provided 30 minutes of non-face-to-face time during this encounter.   Gypsy Balsam, MD  Additional Social History:   Allergies:  No Known Allergies  Metabolic Disorder Labs: No results found for: HGBA1C, MPG No results found for: PROLACTIN No results found for: CHOL, TRIG, HDL, CHOLHDL, VLDL, LDLCALC   Current Medications: Current Outpatient Medications  Medication Sig Dispense Refill   ALPRAZolam (XANAX) 0.25 MG tablet 1   qhs as needed 25 tablet 3   amLODipine (NORVASC) 5 MG tablet Take 5 mg by mouth daily.     ibuprofen (ADVIL,MOTRIN) 800 MG tablet Take 1 tablet (800 mg total) by mouth 3 (three) times daily. 21 tablet 0   oxyCODONE-acetaminophen (PERCOCET/ROXICET) 5-325 MG tablet Take 1 tablet by mouth every 4 (four) hours as needed for severe pain. 6 tablet 0   PRESCRIPTION MEDICATION Apply 1 application topically daily as needed (rosacea).      No current facility-administered medications for this visit.    Neurologic: Headache: Yes Seizure: No Paresthesias:No  Musculoskeletal: Strength & Muscle Tone: within normal limits Gait & Station: normal Patient leans: N/A  Psychiatric Specialty Exam: ROS  There were no vitals taken for this visit.There is no height or weight on file to calculate BMI.  General Appearance: Meticulous  Eye Contact:  Good  Speech:  Clear and Coherent  Volume:  Normal  Mood:  Euthymic  Affect:  Congruent  Thought Process:  Goal Directed  Orientation:  Full (Time, Place, and Person)  Thought Content:  WDL  Suicidal Thoughts:  No  Homicidal Thoughts:  No  Memory:  NA  Judgement:  Good  Insight:  Good  Psychomotor Activity:  Normal  Concentration:    Recall:  Good  Fund of Knowledge:Good  Language: Good  Akathisia:  No  Handed:  Right  AIMS (if indicated):    Assets:  Communication Skills  ADL's:  Intact  Cognition: WNL  Sleep:        Treatment Plan Summary:[name], MD 2/22/20231:35 PM    This patient's diagnosis is adjustment disorder with an anxious mood state.  It is mainly related to work stresses and the stress that comes and goes.  He takes a very small dose of Xanax on a as needed basis and will return to see in 5 months.  He will see me in person on his next visit.  Patient is very stable.  He is functioning extremely well.

## 2022-04-17 DIAGNOSIS — L731 Pseudofolliculitis barbae: Secondary | ICD-10-CM | POA: Diagnosis not present

## 2022-04-17 DIAGNOSIS — I10 Essential (primary) hypertension: Secondary | ICD-10-CM | POA: Diagnosis not present

## 2022-04-28 ENCOUNTER — Telehealth (HOSPITAL_COMMUNITY): Payer: 59 | Admitting: Psychiatry

## 2022-05-04 DIAGNOSIS — H25813 Combined forms of age-related cataract, bilateral: Secondary | ICD-10-CM | POA: Diagnosis not present

## 2022-05-04 DIAGNOSIS — H524 Presbyopia: Secondary | ICD-10-CM | POA: Diagnosis not present

## 2022-05-04 DIAGNOSIS — H5203 Hypermetropia, bilateral: Secondary | ICD-10-CM | POA: Diagnosis not present

## 2022-05-06 DIAGNOSIS — M5010 Cervical disc disorder with radiculopathy, unspecified cervical region: Secondary | ICD-10-CM | POA: Diagnosis not present

## 2022-05-13 ENCOUNTER — Other Ambulatory Visit (HOSPITAL_COMMUNITY): Payer: Self-pay | Admitting: Psychiatry

## 2022-05-14 DIAGNOSIS — M542 Cervicalgia: Secondary | ICD-10-CM | POA: Diagnosis not present

## 2022-05-27 DIAGNOSIS — R972 Elevated prostate specific antigen [PSA]: Secondary | ICD-10-CM | POA: Diagnosis not present

## 2022-06-03 ENCOUNTER — Telehealth (HOSPITAL_COMMUNITY): Payer: 59 | Admitting: Psychiatry

## 2022-06-03 DIAGNOSIS — R972 Elevated prostate specific antigen [PSA]: Secondary | ICD-10-CM | POA: Diagnosis not present

## 2022-06-03 DIAGNOSIS — R3912 Poor urinary stream: Secondary | ICD-10-CM | POA: Diagnosis not present

## 2022-06-03 DIAGNOSIS — N401 Enlarged prostate with lower urinary tract symptoms: Secondary | ICD-10-CM | POA: Diagnosis not present

## 2022-06-10 DIAGNOSIS — M4802 Spinal stenosis, cervical region: Secondary | ICD-10-CM | POA: Diagnosis not present

## 2022-06-10 DIAGNOSIS — M542 Cervicalgia: Secondary | ICD-10-CM | POA: Diagnosis not present

## 2022-06-10 DIAGNOSIS — M4312 Spondylolisthesis, cervical region: Secondary | ICD-10-CM | POA: Diagnosis not present

## 2022-06-10 DIAGNOSIS — M47812 Spondylosis without myelopathy or radiculopathy, cervical region: Secondary | ICD-10-CM | POA: Diagnosis not present

## 2022-06-17 DIAGNOSIS — L820 Inflamed seborrheic keratosis: Secondary | ICD-10-CM | POA: Diagnosis not present

## 2022-06-17 DIAGNOSIS — D2372 Other benign neoplasm of skin of left lower limb, including hip: Secondary | ICD-10-CM | POA: Diagnosis not present

## 2022-06-18 ENCOUNTER — Other Ambulatory Visit (HOSPITAL_COMMUNITY): Payer: Self-pay | Admitting: *Deleted

## 2022-06-18 MED ORDER — ALPRAZOLAM 0.25 MG PO TABS: ORAL_TABLET | ORAL | 0 refills | Status: DC

## 2022-06-23 ENCOUNTER — Encounter (HOSPITAL_COMMUNITY): Payer: Self-pay | Admitting: Psychiatry

## 2022-06-23 ENCOUNTER — Ambulatory Visit (HOSPITAL_BASED_OUTPATIENT_CLINIC_OR_DEPARTMENT_OTHER): Payer: Medicare Other | Admitting: Psychiatry

## 2022-06-23 VITALS — BP 125/80 | HR 73 | Ht 70.0 in | Wt 190.0 lb

## 2022-06-23 DIAGNOSIS — F4323 Adjustment disorder with mixed anxiety and depressed mood: Secondary | ICD-10-CM

## 2022-06-23 MED ORDER — ALPRAZOLAM 0.25 MG PO TABS: ORAL_TABLET | ORAL | 2 refills | Status: DC

## 2022-06-23 NOTE — Progress Notes (Signed)
Psychiatric Initial Adult Assessment   Patient Identification: Dale Hawkins MRN:  [id number] Date of Evaluation:  06/23/2022 Referral Source Dr. Margo Common       Today the patient is doing well.  He is at his baseline.  He has a couple more years to work and then retire.  He is a major very stressful job.  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  While he has 5 weeks medication he only takes 2.  He works very hard.  He takes Xanax 0.25 mg approximately once a week.  This occurs when is overstressed.  Often it occurs when he takes it at night for sleep.  Patient uses no alcohol and uses no drugs.  The patient denies persistent daily depression.  He has never had any psychotic symptoms.  He is extremely stable.  He has minimal to no psychiatric history.  His appearance with his anxiety is overwhelming and causes significant amount of stress.  It is almost more psychological that he has the availability to take Xanax at a tiny dose.  He takes that dose again about once a week.  I think once his job is over and he has a new less stressful job in the session of retirement will be much better and not need any medications. Visit Diagnosis: Adjustment disorder with an anxious mood state History of Present Illness:    Associated Signs/Symptoms: Depression Symptoms:  anxiety, (Hypo) Manic Symptoms:   Anxiety Symptoms:   Psychotic Symptoms:   PTSD Symptoms:   Past Psychiatric History:   Previous Psychotropic Medications: Yes   Substance Abuse History in the last 12 months:  No.  Consequences of Substance Abuse:   Past Medical History:  Past Medical History:  Diagnosis Date   Anxiety    Depression    Diverticulitis    Kidney stone     Past Surgical History:  Procedure Laterality Date   CYST REMOVAL TRUNK     NECK SURGERY  2004    Family Psychiatric History:   Family History:  Family History  Problem Relation Age of Onset   ADD / ADHD Son    ADD /  ADHD Son    Cancer Mother     Social History:   Social History   Socioeconomic History   Marital status: Married    Spouse name: Not on file   Number of children: Not on file   Years of education: Not on file   Highest education level: Not on file  Occupational History   Not on file  Tobacco Use   Smoking status: Never   Smokeless tobacco: Never  Vaping Use   Vaping Use: Never used  Substance and Sexual Activity   Alcohol use: No   Drug use: No   Sexual activity: Yes    Partners: Female    Birth control/protection: None  Other Topics Concern   Not on file  Social History Narrative   Not on file   Social Determinants of Health   Financial Resource Strain: Not on file  Food Insecurity: Not on file  Transportation Needs: Not on file  Physical Activity: Not on file  Stress: Not on file  Social Connections: Not on file    Additional Social History:   Allergies:  No Known Allergies  Metabolic Disorder Labs: No results found for: "HGBA1C", "MPG" No results found for: "PROLACTIN" No results found for: "CHOL", "TRIG", "HDL", "CHOLHDL", "VLDL", "LDLCALC"   Current Medications: Current Outpatient  Medications  Medication Sig Dispense Refill   amLODipine (NORVASC) 5 MG tablet Take 5 mg by mouth daily.     ibuprofen (ADVIL,MOTRIN) 800 MG tablet Take 1 tablet (800 mg total) by mouth 3 (three) times daily. 21 tablet 0   oxyCODONE-acetaminophen (PERCOCET/ROXICET) 5-325 MG tablet Take 1 tablet by mouth every 4 (four) hours as needed for severe pain. 6 tablet 0   PRESCRIPTION MEDICATION Apply 1 application topically daily as needed (rosacea).      ALPRAZolam (XANAX) 0.25 MG tablet 1  qhs as needed 25 tablet 2   No current facility-administered medications for this visit.    Neurologic: Headache: Yes Seizure: No Paresthesias:No  Musculoskeletal: Strength & Muscle Tone: within normal limits Gait & Station: normal Patient leans: N/A  Psychiatric Specialty Exam: ROS   Blood pressure 125/80, pulse 73, height 5\' 10"  (1.778 m), weight 190 lb (86.2 kg).Body mass index is 27.26 kg/m.  General Appearance: Meticulous  Eye Contact:  Good  Speech:  Clear and Coherent  Volume:  Normal  Mood:  Euthymic  Affect:  Congruent  Thought Process:  Goal Directed  Orientation:  Full (Time, Place, and Person)  Thought Content:  WDL  Suicidal Thoughts:  No  Homicidal Thoughts:  No  Memory:  NA  Judgement:  Good  Insight:  Good  Psychomotor Activity:  Normal  Concentration:    Recall:  Good  Fund of Knowledge:Good  Language: Good  Akathisia:  No  Handed:  Right  AIMS (if indicated):    Assets:  Communication Skills  ADL's:  Intact  Cognition: WNL  Sleep:        Treatment Plan Summary:[name] I [name], MD 9/19/20233:23 PM     Adjustment disorder with an anxious mood state.  His anxiety can be overwhelming to the point where it affects his sleep.  Now he takes 0.25 mg of Xanax approximately once a week.  Knowing that Xanax is available to him has its own antianxiety effect.  The patient is not suicidal and is functioning at a very very high level.  Return to see me in 6 months.

## 2022-07-21 DIAGNOSIS — M542 Cervicalgia: Secondary | ICD-10-CM | POA: Diagnosis not present

## 2022-07-30 DIAGNOSIS — M542 Cervicalgia: Secondary | ICD-10-CM | POA: Diagnosis not present

## 2022-08-04 DIAGNOSIS — M542 Cervicalgia: Secondary | ICD-10-CM | POA: Diagnosis not present

## 2022-08-14 DIAGNOSIS — M542 Cervicalgia: Secondary | ICD-10-CM | POA: Diagnosis not present

## 2022-08-19 DIAGNOSIS — M542 Cervicalgia: Secondary | ICD-10-CM | POA: Diagnosis not present

## 2022-08-26 DIAGNOSIS — M542 Cervicalgia: Secondary | ICD-10-CM | POA: Diagnosis not present

## 2022-09-18 DIAGNOSIS — K648 Other hemorrhoids: Secondary | ICD-10-CM | POA: Diagnosis not present

## 2022-09-18 DIAGNOSIS — K5730 Diverticulosis of large intestine without perforation or abscess without bleeding: Secondary | ICD-10-CM | POA: Diagnosis not present

## 2022-09-18 DIAGNOSIS — D123 Benign neoplasm of transverse colon: Secondary | ICD-10-CM | POA: Diagnosis not present

## 2022-09-18 DIAGNOSIS — Z1211 Encounter for screening for malignant neoplasm of colon: Secondary | ICD-10-CM | POA: Diagnosis not present

## 2022-09-22 DIAGNOSIS — D123 Benign neoplasm of transverse colon: Secondary | ICD-10-CM | POA: Diagnosis not present

## 2022-11-02 ENCOUNTER — Other Ambulatory Visit (HOSPITAL_COMMUNITY): Payer: Self-pay | Admitting: *Deleted

## 2022-11-02 MED ORDER — ALPRAZOLAM 0.25 MG PO TABS: ORAL_TABLET | ORAL | 1 refills | Status: DC

## 2022-11-16 DIAGNOSIS — D2261 Melanocytic nevi of right upper limb, including shoulder: Secondary | ICD-10-CM | POA: Diagnosis not present

## 2022-11-16 DIAGNOSIS — L821 Other seborrheic keratosis: Secondary | ICD-10-CM | POA: Diagnosis not present

## 2022-11-16 DIAGNOSIS — D2262 Melanocytic nevi of left upper limb, including shoulder: Secondary | ICD-10-CM | POA: Diagnosis not present

## 2022-11-16 DIAGNOSIS — D225 Melanocytic nevi of trunk: Secondary | ICD-10-CM | POA: Diagnosis not present

## 2022-11-25 DIAGNOSIS — R972 Elevated prostate specific antigen [PSA]: Secondary | ICD-10-CM | POA: Diagnosis not present

## 2022-11-26 DIAGNOSIS — H02831 Dermatochalasis of right upper eyelid: Secondary | ICD-10-CM | POA: Diagnosis not present

## 2022-11-26 DIAGNOSIS — H524 Presbyopia: Secondary | ICD-10-CM | POA: Diagnosis not present

## 2022-11-26 DIAGNOSIS — H02834 Dermatochalasis of left upper eyelid: Secondary | ICD-10-CM | POA: Diagnosis not present

## 2022-11-26 DIAGNOSIS — H2513 Age-related nuclear cataract, bilateral: Secondary | ICD-10-CM | POA: Diagnosis not present

## 2022-11-26 DIAGNOSIS — H04123 Dry eye syndrome of bilateral lacrimal glands: Secondary | ICD-10-CM | POA: Diagnosis not present

## 2022-11-26 DIAGNOSIS — H52223 Regular astigmatism, bilateral: Secondary | ICD-10-CM | POA: Diagnosis not present

## 2022-11-26 DIAGNOSIS — H5203 Hypermetropia, bilateral: Secondary | ICD-10-CM | POA: Diagnosis not present

## 2022-11-30 DIAGNOSIS — Z87442 Personal history of urinary calculi: Secondary | ICD-10-CM | POA: Diagnosis not present

## 2022-11-30 DIAGNOSIS — R972 Elevated prostate specific antigen [PSA]: Secondary | ICD-10-CM | POA: Diagnosis not present

## 2022-11-30 DIAGNOSIS — N400 Enlarged prostate without lower urinary tract symptoms: Secondary | ICD-10-CM | POA: Diagnosis not present

## 2022-12-07 ENCOUNTER — Other Ambulatory Visit: Payer: Self-pay | Admitting: Urology

## 2022-12-07 DIAGNOSIS — R972 Elevated prostate specific antigen [PSA]: Secondary | ICD-10-CM

## 2022-12-08 DIAGNOSIS — S30860A Insect bite (nonvenomous) of lower back and pelvis, initial encounter: Secondary | ICD-10-CM | POA: Diagnosis not present

## 2022-12-08 DIAGNOSIS — W57XXXA Bitten or stung by nonvenomous insect and other nonvenomous arthropods, initial encounter: Secondary | ICD-10-CM | POA: Diagnosis not present

## 2022-12-15 DIAGNOSIS — H5203 Hypermetropia, bilateral: Secondary | ICD-10-CM | POA: Diagnosis not present

## 2022-12-15 DIAGNOSIS — H524 Presbyopia: Secondary | ICD-10-CM | POA: Diagnosis not present

## 2022-12-22 ENCOUNTER — Ambulatory Visit (HOSPITAL_COMMUNITY): Payer: Medicare Other | Admitting: Psychiatry

## 2022-12-23 ENCOUNTER — Ambulatory Visit
Admission: RE | Admit: 2022-12-23 | Discharge: 2022-12-23 | Disposition: A | Discharge status: Home / Self Care | Payer: Medicare Other | Source: Ambulatory Visit | Attending: Urology | Admitting: Urology

## 2022-12-23 DIAGNOSIS — R972 Elevated prostate specific antigen [PSA]: Secondary | ICD-10-CM | POA: Diagnosis not present

## 2022-12-23 MED ORDER — GADOPICLENOL 0.5 MMOL/ML IV SOLN
9.0000 mL | Freq: Once | INTRAVENOUS | Status: AC | PRN
  Administered 2022-12-23: 9 mL via INTRAVENOUS

## 2022-12-28 DIAGNOSIS — R972 Elevated prostate specific antigen [PSA]: Secondary | ICD-10-CM | POA: Diagnosis not present

## 2022-12-28 DIAGNOSIS — L03039 Cellulitis of unspecified toe: Secondary | ICD-10-CM | POA: Diagnosis not present

## 2022-12-28 DIAGNOSIS — N401 Enlarged prostate with lower urinary tract symptoms: Secondary | ICD-10-CM | POA: Diagnosis not present

## 2022-12-28 DIAGNOSIS — R3912 Poor urinary stream: Secondary | ICD-10-CM | POA: Diagnosis not present

## 2022-12-28 DIAGNOSIS — L600 Ingrowing nail: Secondary | ICD-10-CM | POA: Diagnosis not present

## 2023-01-04 DIAGNOSIS — R972 Elevated prostate specific antigen [PSA]: Secondary | ICD-10-CM | POA: Diagnosis not present

## 2023-01-07 DIAGNOSIS — N202 Calculus of kidney with calculus of ureter: Secondary | ICD-10-CM | POA: Diagnosis not present

## 2023-01-07 DIAGNOSIS — K5730 Diverticulosis of large intestine without perforation or abscess without bleeding: Secondary | ICD-10-CM | POA: Diagnosis not present

## 2023-01-07 DIAGNOSIS — N400 Enlarged prostate without lower urinary tract symptoms: Secondary | ICD-10-CM | POA: Diagnosis not present

## 2023-01-07 DIAGNOSIS — N132 Hydronephrosis with renal and ureteral calculous obstruction: Secondary | ICD-10-CM | POA: Diagnosis not present

## 2023-01-12 ENCOUNTER — Ambulatory Visit: Payer: Medicare Other | Admitting: Podiatry

## 2023-01-12 DIAGNOSIS — M48061 Spinal stenosis, lumbar region without neurogenic claudication: Secondary | ICD-10-CM | POA: Insufficient documentation

## 2023-01-12 DIAGNOSIS — L03031 Cellulitis of right toe: Secondary | ICD-10-CM | POA: Diagnosis not present

## 2023-01-12 DIAGNOSIS — M47812 Spondylosis without myelopathy or radiculopathy, cervical region: Secondary | ICD-10-CM | POA: Insufficient documentation

## 2023-01-12 MED ORDER — NEOMYCIN-POLYMYXIN-HC 1 % OT SOLN: OTIC | 1 refills | Status: DC

## 2023-01-12 NOTE — Progress Notes (Signed)
Subjective:  Patient ID: Dale Hawkins, male    DOB: May 14, 1956,  MRN: [id number] HPI Chief Complaint  Patient presents with   Nail Problem    Right hallux possible ingrown nail  He stated that he saw his Primary care about 2 weeks ago and he was given antibiotic for it    67 y.o. male presents with the above complaint.   ROS: Denies fever chills nausea or muscle aches pains calf pain back pain chest pain shortness of breath.  Past Medical History:  Diagnosis Date   Anxiety    Depression    Diverticulitis    Kidney stone    Past Surgical History:  Procedure Laterality Date   CYST REMOVAL TRUNK     NECK SURGERY  2004    Current Outpatient Medications:    cephALEXin (KEFLEX) 500 MG capsule, Take 500 mg by mouth 4 (four) times daily., Disp: , Rfl:    doxycycline (VIBRAMYCIN) 100 MG capsule, Take 100 mg by mouth 2 (two) times daily., Disp: , Rfl:    levofloxacin (LEVAQUIN) 750 MG tablet, Take 750 mg by mouth once., Disp: , Rfl:    NEOMYCIN-POLYMYXIN-HYDROCORTISONE (CORTISPORIN) 1 % SOLN OTIC solution, Apply 1-2 drops to toe BID after soaking, Disp: 10 mL, Rfl: 1   ALPRAZolam (XANAX) 0.25 MG tablet, 1  qhs as needed, Disp: 25 tablet, Rfl: 1   amLODipine (NORVASC) 5 MG tablet, Take 5 mg by mouth daily., Disp: , Rfl:    ibuprofen (ADVIL,MOTRIN) 800 MG tablet, Take 1 tablet (800 mg total) by mouth 3 (three) times daily., Disp: 21 tablet, Rfl: 0   oxyCODONE-acetaminophen (PERCOCET/ROXICET) 5-325 MG tablet, Take 1 tablet by mouth every 4 (four) hours as needed for severe pain., Disp: 6 tablet, Rfl: 0   PRESCRIPTION MEDICATION, Apply 1 application topically daily as needed (rosacea). , Disp: , Rfl:   No Known Allergies Review of Systems Objective:  There were no vitals filed for this visit.  General: Well developed, nourished, in no acute distress, alert and oriented x3   Dermatological: Skin is warm, dry and supple bilateral. Nails x 10 are well maintained; remaining integument  appears unremarkable at this time. There are no open sores, no preulcerative lesions, no rash or signs of infection present.  Hallux right does demonstrate what appears to have been nail trauma that resulted in irritation and infection to the fibular border of the hallux right.  Currently there is only mild erythema along the cutaneous portion of the fibular border.  There appears to be subungual hematoma has been evacuated along the fibular border.  Vascular: Dorsalis Pedis artery and Posterior Tibial artery pedal pulses are 2/4 bilateral with immedate capillary fill time. Pedal hair growth present. No varicosities and no lower extremity edema present bilateral.   Neruologic: Grossly intact via light touch bilateral. Vibratory intact via tuning fork bilateral. Protective threshold with Semmes Wienstein monofilament intact to all pedal sites bilateral. Patellar and Achilles deep tendon reflexes 2+ bilateral. No Babinski or clonus noted bilateral.   Musculoskeletal: No gross boney pedal deformities bilateral. No pain, crepitus, or limitation noted with foot and ankle range of motion bilateral. Muscular strength 5/5 in all groups tested bilateral.  Gait: Unassisted, Nonantalgic.    Radiographs:  None taken  Assessment & Plan:   Assessment: Ingrown toenail fibular border trauma fibular border hallux right  Plan: Partial nail avulsion was performed today after local anesthetic was administered just to remove all necrotic tissue and allow this to drain properly.  There was no purulence there was a small area of drainage from beneath the nail plate but did not appear to be purulent.  All necrotic tissue was sharply resected.  Patient was given both oral and home-going instruction for care and soaking of the toe as well as a prescription for Cortisporin Otic to be applied twice daily.  I like to follow-up with him in a couple weeks major he is doing well.     Julliette Frentz T. [place], [place]

## 2023-01-12 NOTE — Patient Instructions (Addendum)

## 2023-01-13 ENCOUNTER — Encounter (HOSPITAL_COMMUNITY): Payer: Self-pay | Admitting: Psychiatry

## 2023-01-13 ENCOUNTER — Ambulatory Visit (HOSPITAL_BASED_OUTPATIENT_CLINIC_OR_DEPARTMENT_OTHER): Payer: Medicare Other | Admitting: Psychiatry

## 2023-01-13 VITALS — BP 114/79 | HR 85 | Ht 71.0 in | Wt 192.0 lb

## 2023-01-13 DIAGNOSIS — F4323 Adjustment disorder with mixed anxiety and depressed mood: Secondary | ICD-10-CM

## 2023-01-13 NOTE — Progress Notes (Signed)
Psychiatric Initial Adult Assessment   Patient Identification: Dale Hawkins MRN:  [id number] Date of Evaluation:  01/13/2023 Referral Source Dr. Dyane Dustman     Today the patient is doing well.  He is at his baseline.  Retirement is only a year or 2 away.  He takes Xanax only when he is overwhelmed with anxiety which happens maybe once a week.  It affects his sleep.  He takes a very small dose of Xanax on a as needed basis.  He denies daily depression.  He denies any significant symptoms of anxiety that is of significance.  He clearly has insomnia but is induced by an anxious state.  He drinks no alcohol and uses no drugs.  He is very stable.  He is high functioning. Visit Diagnosis: Adjustment disorder with an anxious mood state History of Present Illness:    Associated Signs/Symptoms: Depression Symptoms:  anxiety, (Hypo) Manic Symptoms:   Anxiety Symptoms:   Psychotic Symptoms:   PTSD Symptoms:   Past Psychiatric History:   Previous Psychotropic Medications: Yes   Substance Abuse History in the last 12 months:  No.  Consequences of Substance Abuse:   Past Medical History:  Past Medical History:  Diagnosis Date   Anxiety    Depression    Diverticulitis    Kidney stone     Past Surgical History:  Procedure Laterality Date   CYST REMOVAL TRUNK     NECK SURGERY  2004    Family Psychiatric History:   Family History:  Family History  Problem Relation Age of Onset   ADD / ADHD Son    ADD / ADHD Son    Cancer Mother     Social History:   Social History   Socioeconomic History   Marital status: Married    Spouse name: Not on file   Number of children: Not on file   Years of education: Not on file   Highest education level: Not on file  Occupational History   Not on file  Tobacco Use   Smoking status: Never   Smokeless tobacco: Never  Vaping Use   Vaping Use: Never used  Substance and Sexual Activity   Alcohol use: No   Drug use: No   Sexual  activity: Yes    Partners: Female    Birth control/protection: None  Other Topics Concern   Not on file  Social History Narrative   Not on file   Social Determinants of Health   Financial Resource Strain: Not on file  Food Insecurity: Not on file  Transportation Needs: Not on file  Physical Activity: Not on file  Stress: Not on file  Social Connections: Not on file    Additional Social History:   Allergies:  No Known Allergies  Metabolic Disorder Labs: No results found for: "HGBA1C", "MPG" No results found for: "PROLACTIN" No results found for: "CHOL", "TRIG", "HDL", "CHOLHDL", "VLDL", "LDLCALC"   Current Medications: Current Outpatient Medications  Medication Sig Dispense Refill   ALPRAZolam (XANAX) 0.25 MG tablet 1  qhs as needed 25 tablet 1   amLODipine (NORVASC) 5 MG tablet Take 5 mg by mouth daily.     cephALEXin (KEFLEX) 500 MG capsule Take 500 mg by mouth 4 (four) times daily.     doxycycline (VIBRAMYCIN) 100 MG capsule Take 100 mg by mouth 2 (two) times daily.     ibuprofen (ADVIL,MOTRIN) 800 MG tablet Take 1 tablet (800 mg total) by mouth 3 (three) times daily. 21 tablet 0  levofloxacin (LEVAQUIN) 750 MG tablet Take 750 mg by mouth once.     NEOMYCIN-POLYMYXIN-HYDROCORTISONE (CORTISPORIN) 1 % SOLN OTIC solution Apply 1-2 drops to toe BID after soaking 10 mL 1   oxyCODONE-acetaminophen (PERCOCET/ROXICET) 5-325 MG tablet Take 1 tablet by mouth every 4 (four) hours as needed for severe pain. 6 tablet 0   PRESCRIPTION MEDICATION Apply 1 application topically daily as needed (rosacea).      No current facility-administered medications for this visit.    Neurologic: Headache: Yes Seizure: No Paresthesias:No  Musculoskeletal: Strength & Muscle Tone: within normal limits Gait & Station: normal Patient leans: N/A  Psychiatric Specialty Exam: ROS  Blood pressure 114/79, pulse 85, height 5\' 11"  (1.803 m), weight 192 lb (87.1 kg).Body mass index is 26.78 kg/m.   General Appearance: Meticulous  Eye Contact:  Good  Speech:  Clear and Coherent  Volume:  Normal  Mood:  Euthymic  Affect:  Congruent  Thought Process:  Goal Directed  Orientation:  Full (Time, Place, and Person)  Thought Content:  WDL  Suicidal Thoughts:  No  Homicidal Thoughts:  No  Memory:  NA  Judgement:  Good  Insight:  Good  Psychomotor Activity:  Normal  Concentration:    Recall:  Good  Fund of Knowledge:Good  Language: Good  Akathisia:  No  Handed:  Right  AIMS (if indicated):    Assets:  Communication Skills  ADL's:  Intact  Cognition: WNL  Sleep:        Treatment Plan Summary:Dale Hawkins I Dale Kinnick, MD 4/10/20241:31 PM   This patient's diagnosis is adjustment disorder with an anxious mood state.  He will continue taking very small dose of Xanax on a as needed basis and return to see me in 6 months.  He is very stable at this time.

## 2023-01-19 DIAGNOSIS — N202 Calculus of kidney with calculus of ureter: Secondary | ICD-10-CM | POA: Diagnosis not present

## 2023-01-20 DIAGNOSIS — N201 Calculus of ureter: Secondary | ICD-10-CM | POA: Diagnosis not present

## 2023-02-02 ENCOUNTER — Ambulatory Visit: Payer: Medicare Other | Admitting: Podiatry

## 2023-02-26 DIAGNOSIS — Z23 Encounter for immunization: Secondary | ICD-10-CM | POA: Diagnosis not present

## 2023-02-26 DIAGNOSIS — Z0000 Encounter for general adult medical examination without abnormal findings: Secondary | ICD-10-CM | POA: Diagnosis not present

## 2023-02-26 DIAGNOSIS — Z1159 Encounter for screening for other viral diseases: Secondary | ICD-10-CM | POA: Diagnosis not present

## 2023-02-26 DIAGNOSIS — E7800 Pure hypercholesterolemia, unspecified: Secondary | ICD-10-CM | POA: Diagnosis not present

## 2023-02-26 DIAGNOSIS — I10 Essential (primary) hypertension: Secondary | ICD-10-CM | POA: Diagnosis not present

## 2023-03-15 DIAGNOSIS — I10 Essential (primary) hypertension: Secondary | ICD-10-CM | POA: Diagnosis not present

## 2023-03-15 DIAGNOSIS — R519 Headache, unspecified: Secondary | ICD-10-CM | POA: Diagnosis not present

## 2023-03-17 DIAGNOSIS — K080 Exfoliation of teeth due to systemic causes: Secondary | ICD-10-CM | POA: Diagnosis not present

## 2023-04-06 ENCOUNTER — Encounter: Payer: Self-pay | Admitting: Neurology

## 2023-04-06 ENCOUNTER — Telehealth: Payer: Self-pay | Admitting: Neurology

## 2023-04-06 ENCOUNTER — Ambulatory Visit: Payer: Medicare Other | Admitting: Neurology

## 2023-04-06 VITALS — BP 125/83 | HR 65 | Ht 70.0 in | Wt 196.0 lb

## 2023-04-06 DIAGNOSIS — M542 Cervicalgia: Secondary | ICD-10-CM | POA: Diagnosis not present

## 2023-04-06 DIAGNOSIS — G8929 Other chronic pain: Secondary | ICD-10-CM

## 2023-04-06 NOTE — Progress Notes (Signed)
[facility]    Provider:  Dr Lucia Gaskins Requesting Provider: Tally Joe, MD Primary Care Provider:  Tally Joe, MD  CC:  headaches  HPI:  Dale Hawkins is a 67 y.o. male here as requested by Tally Joe, MD for headaches.  Patient has at least a 10-year history of headaches and he saw Darrow Bussing at the [facility] from 20 13-20 15 (see below).has Cervical spondylosis; Headache; Lumbar foraminal stenosis; and Cervicalgia on their problem list.  He has a past medical history per notes reviewed from Dr. Erling Conte also including essential hypertension, history of kidney stones, acute diverticulitis, anxiety, elevated LDL, he had C-spine surgery with Dr. Lovell Sheehan in 2004.  According to Dr. Erling Conte, he has a longstanding history of headaches on a daily basis and has been followed by a number of specialists over the years for this issue, he also saw Dr. Neale Burly neurologist in 2010 at which time baclofen and topiramate were recommended, he saw Dr. Saunders Revel and reports he has "tried everything", also followed with [facility].  His current headache is described as 3 on a scale of 10 and is bandlike discomfort around the posterior aspect of his head, he did have neck surgery 2004 and has had a few visits with neurosurgery since then, he has an orthopedist he is followed with and they ordered an MRI of the head which was reportedly normal, he typically wakes with a headache every morning which is tight in his occipital region bilaterally.  Diagnosed with chronic daily headaches.  He was diagnosed with migraines by Dr. Catalina Lunger at the [facility].   Patient is here and reports Occipital headaches and in th neck with stiffness.s/p ACDF with Dr. Lovell Sheehan. Ongoing for about 20 years off and on. Numb headache coming up from the cervical spine to the back part of the head and rarely in the temples bilaterally. Dull headache. No light or sound sensitivity, no  pulsating/pounding/throbbing, no nausea, no vomiting, feels like it is coming from his neck. He has tried trigger point and botox and epidural steroid injections. He wakes up every morning with a headache. Mild headaches. He doesn't take anything, rarely and advil and tylenol and tried muscle relaxers, Has not seen any results with any of the medications or procedure he has tried. He has tried a plethora of medications and procedures including  botox in the neck, trigger points as well as ESI and facet injection, massage, dry needling. No signs or symptoms of sleep apnea, ESS is 0. Remained stable for years.    I reviewed Dr. Eulis Manly notes 06/19/2014 and last seen in 3/206 by [facility] diagnosed with migraines: Per Sherlean Foot notes, MRI in 2013 and 2015 normal.  He was headache patient at the [facility] (see one of her notes below) for many years from 2013 - 2016, her neurologic exams have been normal, he was also seen by orthopedics for his neck, per notes patient's had daily headaches for many years and the problem has been gradually worsening the pain when she saw him was located in the occipital region and radiates to the left neck and right neck, dull pressure, associated symptoms neck pain but no blurred vision dizziness nausea phonophobia photophobia vomiting or weakness.  Aggravated by emotional stress.   From a thorough review of records, medications tried > 2 months that can be used in headache/migraine management include: OxyContin, Sansert, Benadryl, Nasonex, Aleve, Celebrex, Flexeril, baclofen, Skelaxin, Zanaflex, Klonopin, Topamax, Effexor, Zoloft,  Cymbalta, Seroquel, Zyprexa, Tylenol, amlodipine (calcium channel blocker for blood pressure), ketorolac injections, Zofran, nortriptyline.,  Memantine.  Telmisartan, botox in the neck, trigger points as well as ESI and facet injection, massage, dry needling.  Reviewed notes, labs and imaging from outside physicians, which  showed:  Cervical myelogram 10/2010:   CERVICAL MYELOGRAM  Lumbar puncture injection with Omnipaque contrast was performed by Dr. Lovell Sheehan  There has been anterior plate and interbody fusion of C5, C6, and C7.  This appears to be a solid bony fusion at both levels.  There is mild retrolisthesis of C4 and C5 with a mild anterior axial defect due to spurring.  There is also mild anterior defect at C3-4.    IMPRESSION:   Satisfactory anterior fusion of C5, C6, and C7.   Mild anterior defects of C3-4 and C4-5.    CT MYELOGRAM CERVICAL SPINE  Entire cervical spine was scanned with multiplanar reconstruction performed in the sagittal and coronal planes with no comparison.   The alignment appears normal on the reconstructed images.  There has been anterior plate fusion of C5, C6, and C7 with the hardware in good position.  There appears to be solid bony fusion at both levels.   C2-3:  Negative.   C3-4:  Negative.   C4-5:  There is disk bulging and spondylosis with posterior osteophyte formation, right greater than left, effacing the anterior subarachnoid space and causing mild flattening of the cord on the right.  There is very mild right foraminal narrowing.  C5-6:  Satisfactory fusion level without stenosis.  C6-7:  Satisfactory fusion level without stenosis.  There is some right foraminal narrowing due to osteophyte.  Canal is patent with a small central osteophyte projecting ventrally.   C7-T1:  Negative.   IMPRESSION:   Satisfactory anterior fusion of C5, C6, and C7.   Disk degeneration and spondylosis, right side predominant, at C4-5 with mild flattening of the cord on the right.  Mild right foraminal encroachment.   Christine Hagan's Note from 2015 HPI  Patient comes in for follow up of headaches This is a chronic problem. Episode onset: Patient with daily headache for many years. The problem occurs daily. The problem has been gradually worsening (At times feeling sick with headaches). The  pain is located in the occipital region. The pain radiates to the left neck and right neck. The quality of the pain is described as dull (pressure). The pain is severe. Associated symptoms include neck pain. Pertinent negatives include no blurred vision, dizziness, nausea, phonophobia, photophobia, vomiting or weakness. The symptoms are aggravated by emotional stress.   Followed patient for many years. Tried multiple preventatives without improvement. A few headaches more severe than his usual headache. Comes in for follow up today.   Seen by Ortho to see if his neck may be playing a role.   Current and past medications: ANALGESICS: Oxycontin  ANTI-MIGRAINE: Sansert  HEART/BP:  DECONGESTANT/ANTIHISTAMINE: Benadryl,Nasonex  ANTI-NAUSEANT  NSAIDS: Aleve,Celebrex  MUSCLE RELAXANTS: Flexeril,Baclofen,Skelaxin,Zanaflex  ANTI-CONVULSANTS: Klonopin,Topamax  STEROIDS:  SLEEPING PILLS/TRANQUILIZERS: Ambien,Seroquel,Tylenol PM,Zyprexa  ANTI-DEPRESSANTS: Cymbalta,Effexor,Zoloft, nortriptyline HERBAL:  FIBROMYALGIA: Cymbalta  HORMONAL:  OTHER:  PROCEDURES FOR HEADACHES: TPI  The patient's allergies, current medications, past family history, past medical history, past social history, past surgical history and problem list were reviewed and updated as appropriate.   Review of Systems  Musculoskeletal: Positive for neck stiffness.  Neurological: Positive for dizziness and headaches.  All other systems reviewed and are negative.   Objective:  Neurologic Exam   Mental  Status  Oriented to person, place, and time.  Attention: normal. Concentration: normal.  Level of consciousness: alert Knowledge: consistent with education.   Cranial Nerves   CN III, IV, VI  Pupils are equal, round, and reactive to light.  CN VIII  Hearing: intact  Motor Exam  Muscle bulk: normal Overall muscle tone: normal  Gait, Coordination, and Reflexes   Gait Gait: normal  Physical Exam  Constitutional:  He is oriented to person, place, and time. He appears well-developed and well-nourished.  HENT:  Head: Normocephalic and atraumatic.  Eyes: Pupils are equal, round, and reactive to light.  Cardiovascular: Normal rate, regular rhythm and normal heart sounds.  Pulmonary/Chest: Effort normal and breath sounds normal.  Neurological: He is oriented to person, place, and time. Gait normal.    Assessment / Plan:   Assessment 1. Chronic migraine without aura, without mention of intractable migraine without mention of status migrainosus    Plan Chronic migraine. Recent MRI in 2013 and 2015 normal. Trial of nortriptyline. Follow up in six months.  Patient's Medications  New Prescriptions  NORTRIPTYLINE (PAMELOR) 10 MG CAPSULE Take 3 capsules (30 mg total) by mouth at bedtime.  Previous Medications  MECLIZINE HCL (ANTIVERT) 25 MG TABLET  Modified Medications  No medications on file  Discontinued Medications  ACETAMINOPHEN (TYLENOL) 500 MG TABLET Take 500 mg by mouth every 6 (six) hours as needed.  CHLORZOXAZONE 750 MG TABS Take 750 mg by mouth 3 (three) times a day as needed.  MEMANTINE HCL ER 28 MG CP24 Take 28 mg by mouth daily.      Latest Ref Rng & Units 03/18/2020   11:16 AM 02/01/2017    3:58 AM 08/29/2016    9:40 AM  CMP  Glucose 70 - 99 mg/dL 409  811  914   BUN 8 - 23 mg/dL 20  23  15    Creatinine 0.61 - 1.24 mg/dL 7.82  9.56  2.13   Sodium 135 - 145 mmol/L 138  140  138   Potassium 3.5 - 5.1 mmol/L 3.9  4.0  3.8   Chloride 98 - 111 mmol/L 104  107  105   CO2 22 - 32 mmol/L 24  26  25    Calcium 8.9 - 10.3 mg/dL 8.7  9.0  9.3   Total Protein 6.5 - 8.1 g/dL 7.2  7.0    Total Bilirubin 0.3 - 1.2 mg/dL 1.2  1.0    Alkaline Phos 38 - 126 U/L 92  100    AST 15 - 41 U/L 19  20    ALT 0 - 44 U/L 20  17        Latest Ref Rng & Units 03/18/2020   11:16 AM 02/01/2017    3:58 AM 08/29/2016    9:40 AM  CBC  WBC 4.0 - 10.5 K/uL 13.4  10.6  7.7   Hemoglobin 13.0 - 17.0 g/dL  08.6  57.8  46.9   Hematocrit 39.0 - 52.0 % 43.8  43.9  45.2   Platelets 150 - 400 K/uL 226  241  250      Review of Systems: Patient complains of symptoms per HPI as well as the following symptoms neck pain. Pertinent negatives and positives per HPI. All others negative.   Social History   Socioeconomic History   Marital status: Married    Spouse name: Not on file   Number of children: Not on file   Years of education: Not on file   Highest  education level: Not on file  Occupational History   Not on file  Tobacco Use   Smoking status: Never   Smokeless tobacco: Never  Vaping Use   Vaping Use: Never used  Substance and Sexual Activity   Alcohol use: No   Drug use: No   Sexual activity: Yes    Partners: Female    Birth control/protection: None  Other Topics Concern   Not on file  Social History Narrative   Not on file   Social Determinants of Health   Financial Resource Strain: Not on file  Food Insecurity: Not on file  Transportation Needs: Not on file  Physical Activity: Not on file  Stress: Not on file  Social Connections: Not on file  Intimate Partner Violence: Not on file    Family History  Problem Relation Age of Onset   Cancer Mother    ADD / ADHD Son    ADD / ADHD Son    Migraines Neg Hx     Past Medical History:  Diagnosis Date   Anxiety    Depression    Diverticulitis    Kidney stone     Patient Active Problem List   Diagnosis Date Noted   Cervicalgia 04/06/2023   Cervical spondylosis 01/12/2023   Lumbar foraminal stenosis 01/12/2023   Headache 04/22/2012    Past Surgical History:  Procedure Laterality Date   CYST REMOVAL TRUNK     NECK SURGERY  2004    Current Outpatient Medications  Medication Sig Dispense Refill   amLODipine (NORVASC) 5 MG tablet Take 5 mg by mouth daily.     No current facility-administered medications for this visit.    Allergies as of 04/06/2023   (No Known Allergies)    Vitals: BP 125/83   Pulse  65   Ht 5\' 10"  (1.778 m)   Wt 196 lb (88.9 kg)   BMI 28.12 kg/m  Last Weight:  Wt Readings from Last 1 Encounters:  04/06/23 196 lb (88.9 kg)   Last Height:   Ht Readings from Last 1 Encounters:  04/06/23 5\' 10"  (1.778 m)     Physical exam: Exam: Gen: NAD, conversant, well nourised,  well groomed                     CV: RRR, no MRG. No Carotid Bruits. No peripheral edema, warm, nontender Eyes: Conjunctivae clear without exudates or hemorrhage Tight cervical muscles  Neuro: Detailed Neurologic Exam  Speech:    Speech is normal; fluent and spontaneous with normal comprehension.  Cognition:    The patient is oriented to person, place, and time;     recent and remote memory intact;     language fluent;     normal attention, concentration,     fund of knowledge Cranial Nerves:    The pupils are equal, round, and reactive to light. The fundi are normal and spontaneous venous pulsations are present. Visual fields are full to finger confrontation. Extraocular movements are intact. Trigeminal sensation is intact and the muscles of mastication are normal. The face is symmetric. The palate elevates in the midline. Hearing intact. Voice is normal. Shoulder shrug is normal. The tongue has normal motion without fasciculations.   Coordination: nml  Gait: nml  Motor Observation:    No asymmetry, no atrophy, and no involuntary movements noted. Tone:    Normal muscle tone.    Posture:    Posture is normal. normal erect    Strength:  Strength is V/V in the upper and lower limbs.      Sensation: intact to LT     Reflex Exam:  DTR's:    Deep tendon reflexes in the upper and lower extremities are normal bilaterally.   Toes:    The toes are downgoing bilaterally.   Clonus:    Clonus is absent.    Assessment/Plan:  Patient is here and reports Occipital headaches from the neck with stiffness.s/p ACDF with Dr. Lovell Sheehan. Ongoing for about 20 years off and on. Numb headache  coming up from the cervical spine to the back part of the head and rarely in the temples bilaterally. Dull headache. No light or sound sensitivity, no pulsating/pounding/throbbing, no nausea, no vomiting, feels like it is coming from his neck. He has tried trigger point and botox and epidural steroid injections. He wakes up every morning with a headache. Mild headaches. He doesn't take anything, rarely and advil and tylenol and tried muscle relaxers, Has not seen any results with any of the medications or procedure he has tried. He has tried a plethora of medications and procedures including  botox in the neck, trigger points as well as ESI and facet injection, massage, dry needling. No signs or symptoms of sleep apnea, ESS is 0. Remained stable for years.   Cervicalgia, has exhausted multiple medications and procedures. Consider spinal stimulator, occipital nerve stimulator or further injections like facet blocks or Radiofrequency ablation of occipital nerve.   I would recommend seeing Dr. Lorrine Kin at [facility] for these procedures consult or f/u Dr. Lovell Sheehan about these  Orders Placed This Encounter  Procedures   Ambulatory referral to Neurosurgery   No orders of the defined types were placed in this encounter.   Cc: Tally Joe, MD,  Tally Joe, MD  Naomie Dean, MD  [facility] [address] [place], [place] [address]  Phone [phone] Fax [phone]

## 2023-04-06 NOTE — Telephone Encounter (Signed)
Referral faxed to [facility]: Phone: [phone]   Fax: [phone] 

## 2023-04-06 NOTE — Patient Instructions (Addendum)
Cervicalgia, has exhausted multiple medications and procedures. Consider spinal stimulator, occipital nerve stimulator or further injections like facet blocks or Radiofrequency ablation of occipital nerve.   I would recommend seeing Dr. Lorrine Kin at [facility] for these procedures consult or f/u Dr. Lovell Sheehan about these

## 2023-04-14 DIAGNOSIS — M4316 Spondylolisthesis, lumbar region: Secondary | ICD-10-CM | POA: Diagnosis not present

## 2023-04-14 DIAGNOSIS — Z6828 Body mass index (BMI) 28.0-28.9, adult: Secondary | ICD-10-CM | POA: Diagnosis not present

## 2023-04-14 DIAGNOSIS — M47812 Spondylosis without myelopathy or radiculopathy, cervical region: Secondary | ICD-10-CM | POA: Diagnosis not present

## 2023-04-26 DIAGNOSIS — M47812 Spondylosis without myelopathy or radiculopathy, cervical region: Secondary | ICD-10-CM | POA: Diagnosis not present

## 2023-05-05 ENCOUNTER — Ambulatory Visit: Payer: Medicare Other | Admitting: Neurology

## 2023-05-06 DIAGNOSIS — K080 Exfoliation of teeth due to systemic causes: Secondary | ICD-10-CM | POA: Diagnosis not present

## 2023-05-18 DIAGNOSIS — M5412 Radiculopathy, cervical region: Secondary | ICD-10-CM | POA: Diagnosis not present

## 2023-06-04 ENCOUNTER — Other Ambulatory Visit (HOSPITAL_COMMUNITY): Payer: Self-pay

## 2023-06-04 MED ORDER — ALPRAZOLAM 0.25 MG PO TABS: ORAL_TABLET | ORAL | 1 refills | Status: AC

## 2023-06-08 ENCOUNTER — Other Ambulatory Visit (HOSPITAL_COMMUNITY): Payer: Self-pay

## 2023-06-21 DIAGNOSIS — M4316 Spondylolisthesis, lumbar region: Secondary | ICD-10-CM | POA: Diagnosis not present

## 2023-06-21 DIAGNOSIS — M47812 Spondylosis without myelopathy or radiculopathy, cervical region: Secondary | ICD-10-CM | POA: Diagnosis not present

## 2023-07-13 DIAGNOSIS — M5416 Radiculopathy, lumbar region: Secondary | ICD-10-CM | POA: Diagnosis not present

## 2023-07-14 ENCOUNTER — Ambulatory Visit (HOSPITAL_COMMUNITY): Payer: Self-pay | Admitting: Psychiatry

## 2023-07-16 DIAGNOSIS — W57XXXA Bitten or stung by nonvenomous insect and other nonvenomous arthropods, initial encounter: Secondary | ICD-10-CM | POA: Diagnosis not present

## 2023-07-16 DIAGNOSIS — S70262A Insect bite (nonvenomous), left hip, initial encounter: Secondary | ICD-10-CM | POA: Diagnosis not present

## 2023-07-26 DIAGNOSIS — R972 Elevated prostate specific antigen [PSA]: Secondary | ICD-10-CM | POA: Diagnosis not present

## 2023-08-02 DIAGNOSIS — N401 Enlarged prostate with lower urinary tract symptoms: Secondary | ICD-10-CM | POA: Diagnosis not present

## 2023-08-02 DIAGNOSIS — N200 Calculus of kidney: Secondary | ICD-10-CM | POA: Diagnosis not present

## 2023-08-02 DIAGNOSIS — R972 Elevated prostate specific antigen [PSA]: Secondary | ICD-10-CM | POA: Diagnosis not present

## 2023-08-02 DIAGNOSIS — R3912 Poor urinary stream: Secondary | ICD-10-CM | POA: Diagnosis not present

## 2023-08-10 ENCOUNTER — Encounter: Payer: Self-pay | Admitting: Family Medicine

## 2023-08-10 DIAGNOSIS — R519 Headache, unspecified: Secondary | ICD-10-CM | POA: Diagnosis not present

## 2023-08-10 DIAGNOSIS — G43909 Migraine, unspecified, not intractable, without status migrainosus: Secondary | ICD-10-CM | POA: Diagnosis not present

## 2023-08-10 DIAGNOSIS — Z8709 Personal history of other diseases of the respiratory system: Secondary | ICD-10-CM | POA: Diagnosis not present

## 2023-08-11 ENCOUNTER — Encounter: Payer: Self-pay | Admitting: Family Medicine

## 2023-08-11 ENCOUNTER — Other Ambulatory Visit: Payer: Self-pay | Admitting: Family Medicine

## 2023-08-11 DIAGNOSIS — R519 Headache, unspecified: Secondary | ICD-10-CM

## 2023-08-11 DIAGNOSIS — G43909 Migraine, unspecified, not intractable, without status migrainosus: Secondary | ICD-10-CM

## 2023-08-14 ENCOUNTER — Encounter (HOSPITAL_COMMUNITY): Payer: Self-pay

## 2023-08-14 ENCOUNTER — Emergency Department (HOSPITAL_COMMUNITY): Payer: Medicare Other

## 2023-08-14 ENCOUNTER — Other Ambulatory Visit: Payer: Self-pay

## 2023-08-14 ENCOUNTER — Emergency Department (HOSPITAL_COMMUNITY)
Admission: EM | Admit: 2023-08-14 | Discharge: 2023-08-14 | Disposition: A | Discharge status: Home / Self Care | Payer: Medicare Other | Attending: Emergency Medicine | Admitting: Emergency Medicine

## 2023-08-14 DIAGNOSIS — R519 Headache, unspecified: Secondary | ICD-10-CM | POA: Diagnosis not present

## 2023-08-14 DIAGNOSIS — R079 Chest pain, unspecified: Secondary | ICD-10-CM | POA: Insufficient documentation

## 2023-08-14 DIAGNOSIS — Z981 Arthrodesis status: Secondary | ICD-10-CM | POA: Diagnosis not present

## 2023-08-14 DIAGNOSIS — R072 Precordial pain: Secondary | ICD-10-CM | POA: Diagnosis not present

## 2023-08-14 DIAGNOSIS — Q2546 Tortuous aortic arch: Secondary | ICD-10-CM | POA: Diagnosis not present

## 2023-08-14 DIAGNOSIS — R0789 Other chest pain: Secondary | ICD-10-CM | POA: Diagnosis not present

## 2023-08-14 LAB — BASIC METABOLIC PANEL
Anion gap: 7 (ref 5–15)
BUN: 21 mg/dL (ref 8–23)
CO2: 23 mmol/L (ref 22–32)
Calcium: 8.9 mg/dL (ref 8.9–10.3)
Chloride: 106 mmol/L (ref 98–111)
Creatinine, Ser: 1.13 mg/dL (ref 0.61–1.24)
GFR, Estimated: >60 mL/min (ref >60)
Glucose, Bld: 109 mg/dL — ABNORMAL HIGH (ref 70–99)
Potassium: 4 mmol/L (ref 3.5–5.1)
Sodium: 136 mmol/L (ref 135–145)

## 2023-08-14 LAB — CBC
HCT: 40.8 % (ref 39.0–52.0)
Hemoglobin: 13.9 g/dL (ref 13.0–17.0)
MCH: 30.8 pg (ref 26.0–34.0)
MCHC: 34.1 g/dL (ref 30.0–36.0)
MCV: 90.5 fL (ref 80.0–100.0)
Platelets: 290 10*3/uL (ref 150–400)
RBC: 4.51 MIL/uL (ref 4.22–5.81)
RDW: 13.2 % (ref 11.5–15.5)
WBC: 8.6 10*3/uL (ref 4.0–10.5)
nRBC: 0 % (ref 0.0–0.2)

## 2023-08-14 LAB — TROPONIN I (HIGH SENSITIVITY)
Troponin I (High Sensitivity): 3 ng/L (ref <18)
Troponin I (High Sensitivity): <2 ng/L (ref <18)

## 2023-08-14 NOTE — ED Triage Notes (Signed)
Pt states that he had some chest tightness and both arms tingled at 0200.

## 2023-08-14 NOTE — ED Provider Notes (Signed)
[place] [facility]MENT AT [facility] Provider Note   CSN: [id number] Arrival date & time: 08/14/23  0356     History  Chief Complaint  Patient presents with   Chest Pain    Dale Hawkins is a 67 y.o. male with past medical history of anxiety, depression, nephrolithiasis presents to emergency department for evaluation of midsternal chest pain with associated bilateral arm "tingling" that started following waking at 0200 this morning. He denies N/V, weakness, SOB, fevers.  The history is provided by the patient. No language interpreter was used.  Chest Pain Associated symptoms: no abdominal pain, no cough, no dizziness, no fatigue, no fever, no headache, no nausea, no numbness, no palpitations, no shortness of breath, no vomiting and no weakness       Home Medications Prior to Admission medications   Medication Sig Start Date End Date Taking? Authorizing Provider  ALPRAZolam Prudy Feeler) 0.25 MG tablet 1  qhs as needed 06/04/23   Plovsky, Earvin Hansen, MD  amLODipine (NORVASC) 5 MG tablet Take 5 mg by mouth daily. 04/11/19   [provider]      Allergies    Patient has no known allergies.    Review of Systems   Review of Systems  Constitutional:  Negative for chills, fatigue and fever.  Respiratory:  Negative for cough, chest tightness, shortness of breath and wheezing.   Cardiovascular:  Positive for chest pain. Negative for palpitations.  Gastrointestinal:  Negative for abdominal pain, constipation, diarrhea, nausea and vomiting.  Neurological:  Negative for dizziness, seizures, weakness, light-headedness, numbness and headaches.    Physical Exam Updated Vital Signs BP 118/77   Pulse (!) 57   Temp 98.1 F (36.7 C) (Oral)   Resp 16   Ht 5\' 10"  (1.778 m)   Wt 88.9 kg   SpO2 99%   BMI 28.12 kg/m  Physical Exam Vitals and nursing note reviewed.  Constitutional:      General: He is not in acute distress.    Appearance: Normal appearance.   HENT:     Head: Normocephalic and atraumatic.  Eyes:     Conjunctiva/sclera: Conjunctivae normal.  Cardiovascular:     Rate and Rhythm: Normal rate.     Pulses:          Radial pulses are 2+ on the right side and 2+ on the left side.     Heart sounds: No murmur heard. Pulmonary:     Effort: Pulmonary effort is normal. No tachypnea or respiratory distress.     Breath sounds: Normal breath sounds.  Abdominal:     General: Bowel sounds are normal.     Palpations: Abdomen is soft.     Tenderness: There is no abdominal tenderness.  Skin:    Coloration: Skin is not jaundiced or pale.  Neurological:     Mental Status: He is alert and oriented to person, place, and time. Mental status is at baseline.     GCS: GCS eye subscore is 4. GCS verbal subscore is 5. GCS motor subscore is 6.     Cranial Nerves: Cranial nerves 2-12 are intact. No facial asymmetry.     Sensory: Sensation is intact.     Motor: No weakness or pronator drift.     Coordination: Coordination normal. Finger-Nose-Finger Test and Heel to [facility] Test normal. Rapid alternating movements normal.     Gait: Gait is intact.     Deep Tendon Reflexes: Reflexes are normal and symmetric.     Comments: Following  commands without difficulty or deficit     ED Results / Procedures / Treatments   Labs (all labs ordered are listed, but only abnormal results are displayed) Labs Reviewed  BASIC METABOLIC PANEL - Abnormal; Notable for the following components:      Result Value   Glucose, Bld 109 (*)    All other components within normal limits  CBC  TROPONIN I (HIGH SENSITIVITY)  TROPONIN I (HIGH SENSITIVITY)    EKG EKG Interpretation Date/Time:  Saturday August 14 2023 04:11:18 EST Ventricular Rate:  64 PR Interval:  158 QRS Duration:  103 QT Interval:  402 QTC Calculation: 415 R Axis:   71  Text Interpretation: Sinus rhythm Confirmed by Palumbo, April ([id number]) on 08/14/2023 4:18:04 AM  Radiology DG Chest 2  View  Result Date: 08/14/2023 CLINICAL DATA:  67 year old male with chest pain, headache, arm numbness. EXAM: CHEST - 2 VIEW COMPARISON:  Chest radiographs 12/02/2020 and earlier. FINDINGS: PA and lateral views 0420 hours. Tortuous aortic arch contour is stable. Heart size remains normal. Other mediastinal contours are within normal limits. Visualized tracheal air column is within normal limits. Stable low normal lung volumes. No pneumothorax, pulmonary edema, pleural effusion or confluent opacity. No acute osseous abnormality identified. Partially visible chronic cervical ACDF. Negative visible bowel gas. IMPRESSION: No acute cardiopulmonary abnormality. Electronically Signed   By: Odessa Fleming M.D.   On: 08/14/2023 06:52    Procedures Procedures    Medications Ordered in ED Medications - No data to display  ED Course/ Medical Decision Making/ A&P                                 Medical Decision Making Amount and/or Complexity of Data Reviewed Labs: ordered. Radiology: ordered.   Patient presents to the ED for concern of chest pain at 0200 this morning, this involves an extensive number of treatment options, and is a complaint that carries with it a high risk of complications and morbidity.  The differential diagnosis includes ACS, arrhythmia, MSK pain, GERD, anxiety. This is not an exhaustive list   Co morbidities that complicate the patient evaluation  None   Additional history obtained:  Additional history obtained from Family, Nursing, and Outside Medical Records   External records from outside source obtained and reviewed   Lab Tests:  I Ordered, and personally interpreted labs.  The pertinent results include:  neg troponin x2   Imaging Studies ordered:  I ordered imaging studies including CXR  I independently visualized and interpreted imaging which showed no acute cardiopulmonary disease I agree with the radiologist interpretation   Cardiac Monitoring:  The  patient was maintained on a cardiac monitor.  I personally viewed and interpreted the cardiac monitored which showed an underlying rhythm of: NSR without STE or ischemia    Problem List / ED Course:  Chest pain   Reevaluation:  After the interventions noted above, I reevaluated the patient and found that they have :improved   Dispostion:  Patient is resting comfortably in bed.  He has no current medical complaints including chest pain, shortness of breath, paresthesia, headache.  He is well-appearing not any physical signs of distress.  Vital signs WNL.  See HPI.  Physical exam significant for no active chest pain or tenderness.  Lung sounds CTAB.  Neurologically intact with no paresthesia. See PE for full exam.  Lab work is significant for troponin negative x 2.  EKG is  normal sinus rhythm with no STE or ischemia.  After consideration of the diagnostic results and the patients response to treatment, I feel that the patent would benefit from follow-up with primary care provider.  As chest pain has been alleviated since being in ED with normal workup, I do not feel that ACS as cause of chest pain.  Patient reports increased anxiety due to new onset of headaches with MRI scheduled on 08/25/23 by PCP and may have contributed to chest pain today.  I discussed findings, disposition, return to emergency department precautions with patient expresses understanding agrees with plan.  Return to emergency department precautions include chest pain, shortness of breath.         Final Clinical Impression(s) / ED Diagnoses Final diagnoses:  Chest pain, unspecified type    Rx / DC Orders ED Discharge Orders     None         Judithann Sheen, PA 08/14/23 1610    Rondel Baton, MD 08/15/23 Hoyte Brunner

## 2023-08-14 NOTE — Discharge Instructions (Addendum)
Thank you for letting us evaluate you today.  Your lab workup was negative for acute electrolyte abnormality, infection.  Your chest x-ray was negative for pneumonia or fluid in the lungs.  Your troponins (which would be elevated if there was a heart injury were both negative making likelihood of heart injury or heart attack unlikely. I have included a referral with cardiology for you to follow up with due to new chest pain. Please follow-up with your primary care provider for scheduled appointment and routine medical maintenance.  You may return to emergency department if you experience chest pain (especially with jaw or arm pain), shortness of breath

## 2023-08-16 ENCOUNTER — Telehealth: Payer: Self-pay | Admitting: Neurology

## 2023-08-16 NOTE — Telephone Encounter (Signed)
Pt is asking for a call to discuss being considered for Nurtec, he has tried other medications with no relief.  Please call pt to discuss.

## 2023-08-23 NOTE — Telephone Encounter (Signed)
If you read my notehe has been to multiple providers and neurologists and I think at this time he should be referred to an [facility] like [facility]. They have a great headache center.

## 2023-08-23 NOTE — Telephone Encounter (Signed)
I called pt and let him know that per Dr. Lucia Gaskins she recommended to see academic center to his headaches.  He has a MRI on Wednesday if that is negative then will see.  He wants to start exercising.  He wants to hold off on referral at this time.  He appreciated call back.

## 2023-08-24 DIAGNOSIS — L821 Other seborrheic keratosis: Secondary | ICD-10-CM | POA: Diagnosis not present

## 2023-08-24 DIAGNOSIS — L570 Actinic keratosis: Secondary | ICD-10-CM | POA: Diagnosis not present

## 2023-08-25 ENCOUNTER — Inpatient Hospital Stay
Admission: RE | Admit: 2023-08-25 | Discharge: 2023-08-25 | Discharge status: Home / Self Care | Payer: Medicare Other | Source: Ambulatory Visit | Attending: Family Medicine | Admitting: Family Medicine

## 2023-08-25 DIAGNOSIS — G43909 Migraine, unspecified, not intractable, without status migrainosus: Secondary | ICD-10-CM | POA: Diagnosis not present

## 2023-08-25 DIAGNOSIS — R519 Headache, unspecified: Secondary | ICD-10-CM

## 2023-08-25 DIAGNOSIS — R9082 White matter disease, unspecified: Secondary | ICD-10-CM | POA: Diagnosis not present

## 2023-08-25 DIAGNOSIS — M542 Cervicalgia: Secondary | ICD-10-CM | POA: Diagnosis not present

## 2023-08-25 MED ORDER — GADOPICLENOL 0.5 MMOL/ML IV SOLN
9.0000 mL | Freq: Once | INTRAVENOUS | Status: AC | PRN
  Administered 2023-08-25: 9 mL via INTRAVENOUS

## 2023-08-27 ENCOUNTER — Other Ambulatory Visit: Payer: Medicare Other

## 2023-09-27 DIAGNOSIS — J069 Acute upper respiratory infection, unspecified: Secondary | ICD-10-CM | POA: Diagnosis not present

## 2023-09-27 DIAGNOSIS — R519 Headache, unspecified: Secondary | ICD-10-CM | POA: Diagnosis not present

## 2023-10-01 DIAGNOSIS — J069 Acute upper respiratory infection, unspecified: Secondary | ICD-10-CM | POA: Diagnosis not present

## 2023-10-12 ENCOUNTER — Encounter: Payer: Self-pay | Admitting: Neurology

## 2023-10-12 ENCOUNTER — Ambulatory Visit: Payer: Medicare Other | Admitting: Neurology

## 2023-10-12 VITALS — BP 117/80 | HR 72 | Ht 70.0 in | Wt 194.6 lb

## 2023-10-12 DIAGNOSIS — G43711 Chronic migraine without aura, intractable, with status migrainosus: Secondary | ICD-10-CM | POA: Diagnosis not present

## 2023-10-12 MED ORDER — NURTEC 75 MG PO TBDP: 75.0000 mg | ORAL_TABLET | Freq: Every day | ORAL | Status: DC | PRN

## 2023-10-12 MED ORDER — EMGALITY 120 MG/ML ~~LOC~~ SOAJ: 120.0000 mg | SUBCUTANEOUS | 11 refills | Status: DC

## 2023-10-12 MED ORDER — GALCANEZUMAB-GNLM 120 MG/ML ~~LOC~~ SOAJ: 240.0000 mg | Freq: Once | SUBCUTANEOUS | Status: DC

## 2023-10-12 NOTE — Progress Notes (Signed)
 [facility]    Provider:  Dr Ines Requesting Provider: Seabron Lenis, MD Primary Care Provider:  Seabron Lenis, MD  CC:  headaches, migraines  10/12/2023: Patient was seen this past July for chronic intractable cervicalgia and was recommended to follow-up for cervical spine injections.  Patient has chronic history over 10 years and is seen other headache doctors diagnosed with cervical spondylosis, longstanding history of headaches on a daily basis followed by a number of specialists over the years, he saw the [facility] Dr. Brutus Dxed with migraines, he is also been to the [facility] and by his own report he is tried everything and the headache is at the posterior aspect of his head he does have a history of cervical surgery and the headaches are centered around the occipital area.  He was also diagnosed with migraines by Dr. Brutus at the [facility] headache center.  He denies migrainous symptoms however no light or sound sensitivity, no pulsating pounding throbbing, no nausea no vomiting and feels like it is coming from his neck.Has not seen any results with any of the medications or procedure he has tried. He has tried a plethora of medications and procedures including  botox in the neck, trigger points as well as ESI and facet injection, massage, dry needling. No signs or symptoms of sleep apnea, ESS is 0. Remained stable for years.   He has an appointment with [facility] tomorrow he had a back injection and a neck injection and he is going back tomorrow for follow up. Patient states his headaches are numb in the back of the head. They are in the back of the head and in the temples. Daily. Since the MRi they have not been as bad. Daily throughout the day. He has daily headaches. A dull pain. Not throbbing/pulsating/pounding. No light sensitivity. No nausea,No sensitivity to light or smells. He has been diagnosed with migraines in the past.   Patient  complains of symptoms per HPI as well as the following symptoms: none . Pertinent negatives and positives per HPI. All others negative:  HPI 04/06/2023:  VANDERBILT RANIERI is a 68 y.o. male here as requested by Seabron Lenis, MD for headaches.  Patient has at least a 10-year history of headaches and he saw Wanda Brutus at the [facility] from 20 13-20 15 (see below).has Cervical spondylosis; Headache; Lumbar foraminal stenosis; and Cervicalgia on their problem list.  He has a past medical history per notes reviewed from Dr. Jeralyn also including essential hypertension, history of kidney stones, acute diverticulitis, anxiety, elevated LDL, he had C-spine surgery with Dr. Mavis in 2004.  According to Dr. Jeralyn, he has a longstanding history of headaches on a daily basis and has been followed by a number of specialists over the years for this issue, he also saw Dr. Oneita neurologist in 2010 at which time baclofen and topiramate were recommended, he saw Dr. Cora Schirmer and reports he has tried everything, also followed with [facility].  His current headache is described as 3 on a scale of 10 and is bandlike discomfort around the posterior aspect of his head, he did have neck surgery 2004 and has had a few visits with neurosurgery since then, he has an orthopedist he is followed with and they ordered an MRI of the head which was reportedly normal, he typically wakes with a headache every morning which is tight in his occipital region bilaterally.  Diagnosed with chronic daily headaches.  He  was diagnosed with migraines by Dr. Hagan at the [facility] headache center.   Patient is here and reports Occipital headaches and in th neck with stiffness.s/p ACDF with Dr. Mavis. Ongoing for about 20 years off and on. Numb headache coming up from the cervical spine to the back part of the head and rarely in the temples bilaterally. Dull headache. No light or sound sensitivity, no  pulsating/pounding/throbbing, no nausea, no vomiting, feels like it is coming from his neck. He has tried trigger point and botox and epidural steroid injections. He wakes up every morning with a headache. Mild headaches. He doesn't take anything, rarely and advil  and tylenol  and tried muscle relaxers, Has not seen any results with any of the medications or procedure he has tried. He has tried a plethora of medications and procedures including  botox in the neck, trigger points as well as ESI and facet injection, massage, dry needling. No signs or symptoms of sleep apnea, ESS is 0. Remained stable for years.    I reviewed Dr. Letcher notes 06/19/2014 and last seen in 3/206 by [facility] diagnosed with migraines: Per Wanda Slough notes, MRI in 2013 and 2015 normal.  He was headache patient at the [facility] clinic (see one of her notes below) for many years from 2013 - 2016, her neurologic exams have been normal, he was also seen by orthopedics for his neck, per notes patient's had daily headaches for many years and the problem has been gradually worsening the pain when she saw him was located in the occipital region and radiates to the left neck and right neck, dull pressure, associated symptoms neck pain but no blurred vision dizziness nausea phonophobia photophobia vomiting or weakness.  Aggravated by emotional stress.   From a thorough review of records, medications tried > 3 months that can be used in headache/migraine management include: OxyContin , Sansert, Benadryl, Nasonex, Aleve, Celebrex, Flexeril, baclofen, Skelaxin, Zanaflex, Klonopin, Topamax, Effexor, Zoloft, Cymbalta, Seroquel, Zyprexa, Tylenol , amlodipine (calcium  channel blocker for blood pressure), ketorolac  injections, Zofran , nortriptyline.,  Memantine.  Telmisartan, botox in the neck, trigger points as well as ESI and facet injection, massage, dry needling. Aimovig contraindicated due to constipation.   Reviewed notes, labs and  imaging from outside physicians, which showed:  Cervical myelogram 10/2010:   CERVICAL MYELOGRAM  Lumbar puncture injection with Omnipaque  contrast was performed by Dr. Mavis  There has been anterior plate and interbody fusion of C5, C6, and C7.  This appears to be a solid bony fusion at both levels.  There is mild retrolisthesis of C4 and C5 with a mild anterior axial defect due to spurring.  There is also mild anterior defect at C3-4.    IMPRESSION:   Satisfactory anterior fusion of C5, C6, and C7.   Mild anterior defects of C3-4 and C4-5.    CT MYELOGRAM CERVICAL SPINE  Entire cervical spine was scanned with multiplanar reconstruction performed in the sagittal and coronal planes with no comparison.   The alignment appears normal on the reconstructed images.  There has been anterior plate fusion of C5, C6, and C7 with the hardware in good position.  There appears to be solid bony fusion at both levels.   C2-3:  Negative.   C3-4:  Negative.   C4-5:  There is disk bulging and spondylosis with posterior osteophyte formation, right greater than left, effacing the anterior subarachnoid space and causing mild flattening of the cord on the right.  There is very mild right foraminal narrowing.  C5-6:  Satisfactory fusion level without stenosis.  C6-7:  Satisfactory fusion level without stenosis.  There is some right foraminal narrowing due to osteophyte.  Canal is patent with a small central osteophyte projecting ventrally.   C7-T1:  Negative.   IMPRESSION:   Satisfactory anterior fusion of C5, C6, and C7.   Disk degeneration and spondylosis, right side predominant, at C4-5 with mild flattening of the cord on the right.  Mild right foraminal encroachment.   Christine Hagan's Note from 2015 HPI  Patient comes in for follow up of headaches This is a chronic problem. Episode onset: Patient with daily headache for many years. The problem occurs daily. The problem has been gradually worsening (At  times feeling sick with headaches). The pain is located in the occipital region. The pain radiates to the left neck and right neck. The quality of the pain is described as dull (pressure). The pain is severe. Associated symptoms include neck pain. Pertinent negatives include no blurred vision, dizziness, nausea, phonophobia, photophobia, vomiting or weakness. The symptoms are aggravated by emotional stress.   Followed patient for many years. Tried multiple preventatives without improvement. A few headaches more severe than his usual headache. Comes in for follow up today.   Seen by Ortho to see if his neck may be playing a role.   Current and past medications: ANALGESICS: Oxycontin   ANTI-MIGRAINE: Sansert  HEART/BP:  DECONGESTANT/ANTIHISTAMINE: Benadryl,Nasonex  ANTI-NAUSEANT  NSAIDS: Aleve,Celebrex  MUSCLE RELAXANTS: Flexeril,Baclofen,Skelaxin,Zanaflex  ANTI-CONVULSANTS: Klonopin,Topamax  STEROIDS:  SLEEPING PILLS/TRANQUILIZERS: Ambien,Seroquel,Tylenol  PM,Zyprexa  ANTI-DEPRESSANTS: Cymbalta,Effexor,Zoloft, nortriptyline HERBAL:  FIBROMYALGIA: Cymbalta  HORMONAL:  OTHER:  PROCEDURES FOR HEADACHES: TPI  The patient's allergies, current medications, past family history, past medical history, past social history, past surgical history and problem list were reviewed and updated as appropriate.   Review of Systems  Musculoskeletal: Positive for neck stiffness.  Neurological: Positive for dizziness and headaches.  All other systems reviewed and are negative.   Objective:  Neurologic Exam   Mental Status  Oriented to person, place, and time.  Attention: normal. Concentration: normal.  Level of consciousness: alert Knowledge: consistent with education.   Cranial Nerves   CN III, IV, VI  Pupils are equal, round, and reactive to light.  CN VIII  Hearing: intact  Motor Exam  Muscle bulk: normal Overall muscle tone: normal  Gait, Coordination, and Reflexes   Gait Gait:  normal  Physical Exam  Constitutional: He is oriented to person, place, and time. He appears well-developed and well-nourished.  HENT:  Head: Normocephalic and atraumatic.  Eyes: Pupils are equal, round, and reactive to light.  Cardiovascular: Normal rate, regular rhythm and normal heart sounds.  Pulmonary/Chest: Effort normal and breath sounds normal.  Neurological: He is oriented to person, place, and time. Gait normal.    Assessment / Plan:   Assessment 1. Chronic migraine without aura, without mention of intractable migraine without mention of status migrainosus    Plan Chronic migraine. Recent MRI in 2013 and 2015 normal. Trial of nortriptyline. Follow up in six months.  Patient's Medications  New Prescriptions  NORTRIPTYLINE (PAMELOR) 10 MG CAPSULE Take 3 capsules (30 mg total) by mouth at bedtime.  Previous Medications  MECLIZINE HCL (ANTIVERT) 25 MG TABLET  Modified Medications  No medications on file  Discontinued Medications  ACETAMINOPHEN  (TYLENOL ) 500 MG TABLET Take 500 mg by mouth every 6 (six) hours as needed.  CHLORZOXAZONE 750 MG TABS Take 750 mg by mouth 3 (three) times a day as needed.  MEMANTINE HCL ER 28 MG  CP24 Take 28 mg by mouth daily.      Latest Ref Rng & Units 08/14/2023    4:27 AM 03/18/2020   11:16 AM 02/01/2017    3:58 AM  CMP  Glucose 70 - 99 mg/dL 890  887  884   BUN 8 - 23 mg/dL 21  20  23    Creatinine 0.61 - 1.24 mg/dL 8.86  8.67  8.58   Sodium 135 - 145 mmol/L 136  138  140   Potassium 3.5 - 5.1 mmol/L 4.0  3.9  4.0   Chloride 98 - 111 mmol/L 106  104  107   CO2 22 - 32 mmol/L 23  24  26    Calcium  8.9 - 10.3 mg/dL 8.9  8.7  9.0   Total Protein 6.5 - 8.1 g/dL  7.2  7.0   Total Bilirubin 0.3 - 1.2 mg/dL  1.2  1.0   Alkaline Phos 38 - 126 U/L  92  100   AST 15 - 41 U/L  19  20   ALT 0 - 44 U/L  20  17       Latest Ref Rng & Units 08/14/2023    4:27 AM 03/18/2020   11:16 AM 02/01/2017    3:58 AM  CBC  WBC 4.0 - 10.5 K/uL 8.6  13.4   10.6   Hemoglobin 13.0 - 17.0 g/dL 86.0  84.9  84.1   Hematocrit 39.0 - 52.0 % 40.8  43.8  43.9   Platelets 150 - 400 K/uL 290  226  241      Review of Systems: Patient complains of symptoms per HPI as well as the following symptoms neck pain. Pertinent negatives and positives per HPI. All others negative.   Social History   Socioeconomic History   Marital status: Married    Spouse name: Not on file   Number of children: Not on file   Years of education: Not on file   Highest education level: Not on file  Occupational History   Not on file  Tobacco Use   Smoking status: Never   Smokeless tobacco: Never  Vaping Use   Vaping status: Never Used  Substance and Sexual Activity   Alcohol use: No   Drug use: No   Sexual activity: Yes    Partners: Female    Birth control/protection: None  Other Topics Concern   Not on file  Social History Narrative   Caffiene occ (dr pepper)   Working: transportation field seismologist.    Social Drivers of Corporate Investment Banker Strain: Low Risk  (09/14/2021)   Received from [facility], [facility]   Overall Financial Resource Strain (CARDIA)    Difficulty of Paying Living Expenses: Not hard at all  Food Insecurity: No Food Insecurity (09/14/2021)   Received from [facility], [facility]   Hunger Vital Sign    Worried About Running Out of Food in the Last Year: Never true    Ran Out of Food in the Last Year: Never true  Transportation Needs: No Transportation Needs (09/14/2021)   Received from [facility], [facility]   PRAPARE - Transportation    Lack of Transportation (Medical): No    Lack of Transportation (Non-Medical): No  Physical Activity: Sufficiently Active (09/14/2021)   Received from [facility], [facility]   Exercise Vital Sign    Days of Exercise per Week: 5 days    Minutes of Exercise per Session: 30 min  Stress: No Stress Concern  Present (09/14/2021)   Received from [facility], [facility] - Occupational Stress Questionnaire    Feeling of Stress : Only a little  Social Connections: Unknown (02/15/2022)   Received from [facility], [facility]   Social Network    Social Network: Not on file  Intimate Partner Violence: Unknown (01/07/2022)   Received from [facility], [facility]   HITS    Physically Hurt: Not on file    Insult or Talk Down To: Not on file    Threaten Physical Harm: Not on file    Scream or Curse: Not on file    Family History  Problem Relation Age of Onset   Cancer Mother    ADD / ADHD Son    ADD / ADHD Son    Migraines Neg Hx     Past Medical History:  Diagnosis Date   Anxiety    Depression    Diverticulitis    Hypertension    Kidney stone     Patient Active Problem List   Diagnosis Date Noted   Cervicalgia 04/06/2023   Cervical spondylosis 01/12/2023   Lumbar foraminal stenosis 01/12/2023   Headache 04/22/2012    Past Surgical History:  Procedure Laterality Date   CYST REMOVAL TRUNK     NECK SURGERY  2004    Current Outpatient Medications  Medication Sig Dispense Refill   ALPRAZolam  (XANAX ) 0.25 MG tablet 1  qhs as needed 25 tablet 1   amLODipine (NORVASC) 5 MG tablet Take 5 mg by mouth daily.     Galcanezumab -gnlm (EMGALITY ) 120 MG/ML SOAJ Inject 120 mg into the skin every 30 (thirty) days. 1.12 mL 11   Rimegepant Sulfate (NURTEC) 75 MG TBDP Take 1 tablet (75 mg total) by mouth daily as needed. For migraines. Take as close to onset of migraine as possible. One daily maximum.     telmisartan (MICARDIS) 20 MG tablet Take 20 mg by mouth daily.     HYDROcodone bit-homatropine (HYCODAN) 5-1.5 MG/5ML syrup Take 5 mLs by mouth every 6 (six) hours as needed. (Patient not taking: Reported on 10/12/2023)     Current Facility-Administered Medications  Medication Dose Route Frequency Provider Last Rate Last Admin   Galcanezumab -gnlm SOAJ 240 mg  240 mg Subcutaneous Once          Allergies as of 10/12/2023   (No Known Allergies)    Vitals: BP 117/80 (Cuff Size: Normal)   Pulse 72   Ht 5' 10 (1.778 m)   Wt 194 lb 9.6 oz (88.3 kg)   BMI 27.92 kg/m  Last Weight:  Wt Readings from Last 1 Encounters:  10/12/23 194 lb 9.6 oz (88.3 kg)   Last Height:   Ht Readings from Last 1 Encounters:  10/12/23 5' 10 (1.778 m)   Exam: NAD, pleasant                  Speech:    Speech is normal; fluent and spontaneous with normal comprehension.  Cognition:    The patient is oriented to person, place, and time;     recent and remote memory intact;     language fluent;    Cranial Nerves:    The pupils are equal, round, and reactive to light.Trigeminal sensation is intact and the muscles of mastication are normal. The face is symmetric. The palate elevates in the midline. Hearing intact. Voice is normal. Shoulder shrug is normal. The tongue has normal motion without fasciculations.  Coordination:  No dysmetria  Motor Observation:    No asymmetry, no atrophy, and no involuntary movements noted. Tone:    Normal muscle tone.     Strength:    Strength is V/V in the upper and lower limbs.      Sensation: intact to LT      Assessment/Plan:  Patient is here and reports chronic migraines (daily for years) Also Occipital headaches from the neck with stiffness.s/p ACDF with Dr. Mavis. Ongoing for about 20 years off and on cervicalgia. Patient was seen this past July for chronic intractable cervicalgia and was recommended to follow-up for cervical spine injections.  Patient has chronic history over 10 years and is seen other headache doctors diagnosed with cervical spondylosis, longstanding history of headaches on a daily basis followed by a number of specialists over the years, he saw the [facility] Dr. Brutus diagnosis migraines he has also been to the [facility] and by his own report he is tried everything and the headache is at the posterior  aspect of his head he does have a history of cervical surgery and the headaches are centered around the occipital area.  He was also diagnosed with migraines by Dr. Brutus at the [facility] headache center and reports daily migraines.  He denies migrainous symptoms however no light or sound sensitivity, no pulsating pounding throbbing, no nausea no vomiting and feels like it is coming from his neck.Has not seen any results with any of the medications or procedure he has tried. He has tried a plethora of medications and procedures including  botox in the neck, trigger points as well as ESI and facet injection, massage, dry needling. No signs or symptoms of sleep apnea, ESS is 0. Remained stable for years. )  Can try emgality  for prevention Nurtec samples to try  If not approved can get him samples for next 3 months to give it a good try or we can try another injection or qulipta to get approved.   From a thorough review of records, medications tried > 3 months that can be used in headache/migraine management include: OxyContin , Sansert, Benadryl, Nasonex, Aleve, Celebrex, Flexeril, baclofen, Skelaxin, Zanaflex, Klonopin, Topamax, Effexor, Zoloft, Cymbalta, Seroquel, Zyprexa, Tylenol , amlodipine (calcium  channel blocker for blood pressure), ketorolac  injections, Zofran , nortriptyline.,  Memantine.  Telmisartan, botox in the neck, trigger points as well as ESI and facet injection, massage, dry needling. Aimovig contraindicated due to constipation.   No orders of the defined types were placed in this encounter.  Meds ordered this encounter  Medications   Galcanezumab -gnlm SOAJ 240 mg   Galcanezumab -gnlm (EMGALITY ) 120 MG/ML SOAJ    Sig: Inject 120 mg into the skin every 30 (thirty) days.    Dispense:  1.12 mL    Refill:  11   Rimegepant Sulfate (NURTEC) 75 MG TBDP    Sig: Take 1 tablet (75 mg total) by mouth daily as needed. For migraines. Take as close to onset of migraine as possible. One daily maximum.     Cc: Seabron Lenis, MD,  Seabron Lenis, MD  Onetha Epp, MD  [facility] [address] [place], [place] [address]  Phone [phone] Fax [phone]  I spent over 30 minutes of face-to-face and non-face-to-face time with patient on the  1. Chronic migraine without aura, with intractable migraine, so stated, with status migrainosus    diagnosis.  This included previsit chart review, lab review, study review, order entry, electronic health record documentation, patient education on  the different diagnostic and therapeutic options, counseling and coordination of care, risks and benefits of management, compliance, or risk factor reduction

## 2023-10-12 NOTE — Patient Instructions (Addendum)
 New medications: Ajovy, Emgality  and Aimovig: FDA approved for migraines. And you have to have > 8 migraine days a month and > 15 total headache days a month. We also have a pill called Qulipta.  Let's try Emgality  once monthly. Will try and get it approved. Will inject loading dose today (2 injections). After that, it is one injection monthly. Take the next one on 2/1 and then the first of every month As needed; Try nurtec. As needed once daily for headache.  `Galcanezumab  Injection What is this medication? GALCANEZUMAB  (gal ka NEZ ue mab) prevents migraines. It works by blocking a substance in the body that causes migraines. It may also be used to treat cluster headaches. It is a monoclonal antibody. This medicine may be used for other purposes; ask your health care provider or pharmacist if you have questions. COMMON BRAND NAME(S): Emgality  What should I tell my care team before I take this medication? They need to know if you have any of these conditions: An unusual or allergic reaction to galcanezumab , other medications, foods, dyes, or preservatives Pregnant or trying to get pregnant Breast-feeding How should I use this medication? This medication is injected under the skin. You will be taught how to prepare and give it. Take it as directed on the prescription label. Keep taking it unless your care team tells you to stop. It is important that you put your used needles and syringes in a special sharps container. Do not put them in a trash can. If you do not have a sharps container, call your pharmacist or care team to get one. Talk to your care team about the use of this medication in children. Special care may be needed. Overdosage: If you think you have taken too much of this medicine contact a poison control center or emergency room at once. NOTE: This medicine is only for you. Do not share this medicine with others. What if I miss a dose? If you miss a dose, take it as soon as you can.  If it is almost time for your next dose, take only that dose. Do not take double or extra doses. What may interact with this medication? Interactions are not expected. This list may not describe all possible interactions. Give your health care provider a list of all the medicines, herbs, non-prescription drugs, or dietary supplements you use. Also tell them if you smoke, drink alcohol, or use illegal drugs. Some items may interact with your medicine. What should I watch for while using this medication? Visit your care team for regular checks on your progress. Tell your care team if your symptoms do not start to get better or if they get worse. What side effects may I notice from receiving this medication? Side effects that you should report to your care team as soon as possible: Allergic reactions or angioedema--skin rash, itching or hives, swelling of the face, eyes, lips, tongue, arms, or legs, trouble swallowing or breathing Side effects that usually do not require medical attention (report to your care team if they continue or are bothersome): Pain, redness, or irritation at injection site This list may not describe all possible side effects. Call your doctor for medical advice about side effects. You may report side effects to FDA at 1-800-FDA-1088. Where should I keep my medication? Keep out of the reach of children and pets. Store in a refrigerator or at room temperature between 20 and 25 degrees C (68 and 77 degrees F). Refrigeration (preferred): Store in the  refrigerator. Do not freeze. Keep in the original container until you are ready to take it. Remove the dose from the carton about 30 minutes before it is time for you to use it. If the dose is not used, it may be stored in original container at room temperature for 7 days. Get rid of any unused medication after the expiration date. Room Temperature: This medication may be stored at room temperature for up to 7 days. Keep it in the  original container. Protect from light until time of use. If it is stored at room temperature, get rid of any unused medication after 7 days or after it expires, whichever is first. To get rid of medications that are no longer needed or have expired: Take the medication to a medication take-back program. Check with your pharmacy or law enforcement to find a location. If you cannot return the medication, ask your pharmacist or care team how to get rid of this medication safely. NOTE: This sheet is a summary. It may not cover all possible information. If you have questions about this medicine, talk to your doctor, pharmacist, or health care provider.  2024 Elsevier/Gold Standard (2021-11-17 00:00:00)

## 2023-10-13 DIAGNOSIS — M542 Cervicalgia: Secondary | ICD-10-CM | POA: Diagnosis not present

## 2023-10-13 DIAGNOSIS — M4316 Spondylolisthesis, lumbar region: Secondary | ICD-10-CM | POA: Diagnosis not present

## 2023-10-13 DIAGNOSIS — M5416 Radiculopathy, lumbar region: Secondary | ICD-10-CM | POA: Diagnosis not present

## 2023-11-05 ENCOUNTER — Ambulatory Visit: Payer: Medicare Other | Admitting: Cardiology

## 2023-11-11 DIAGNOSIS — H524 Presbyopia: Secondary | ICD-10-CM | POA: Diagnosis not present

## 2023-11-16 DIAGNOSIS — R972 Elevated prostate specific antigen [PSA]: Secondary | ICD-10-CM | POA: Diagnosis not present

## 2023-11-16 DIAGNOSIS — N200 Calculus of kidney: Secondary | ICD-10-CM | POA: Diagnosis not present

## 2023-11-16 DIAGNOSIS — N401 Enlarged prostate with lower urinary tract symptoms: Secondary | ICD-10-CM | POA: Diagnosis not present

## 2023-11-16 DIAGNOSIS — R3912 Poor urinary stream: Secondary | ICD-10-CM | POA: Diagnosis not present

## 2023-11-17 DIAGNOSIS — D225 Melanocytic nevi of trunk: Secondary | ICD-10-CM | POA: Diagnosis not present

## 2023-11-17 DIAGNOSIS — D2261 Melanocytic nevi of right upper limb, including shoulder: Secondary | ICD-10-CM | POA: Diagnosis not present

## 2023-11-17 DIAGNOSIS — L814 Other melanin hyperpigmentation: Secondary | ICD-10-CM | POA: Diagnosis not present

## 2023-11-17 DIAGNOSIS — L821 Other seborrheic keratosis: Secondary | ICD-10-CM | POA: Diagnosis not present

## 2023-12-22 DIAGNOSIS — M5416 Radiculopathy, lumbar region: Secondary | ICD-10-CM | POA: Diagnosis not present

## 2024-01-04 DIAGNOSIS — R972 Elevated prostate specific antigen [PSA]: Secondary | ICD-10-CM | POA: Diagnosis not present

## 2024-01-04 DIAGNOSIS — Z87442 Personal history of urinary calculi: Secondary | ICD-10-CM | POA: Diagnosis not present

## 2024-01-12 DIAGNOSIS — M5416 Radiculopathy, lumbar region: Secondary | ICD-10-CM | POA: Diagnosis not present

## 2024-01-12 DIAGNOSIS — M5136 Other intervertebral disc degeneration, lumbar region with discogenic back pain only: Secondary | ICD-10-CM | POA: Diagnosis not present

## 2024-01-12 DIAGNOSIS — M48061 Spinal stenosis, lumbar region without neurogenic claudication: Secondary | ICD-10-CM | POA: Diagnosis not present

## 2024-01-12 DIAGNOSIS — M5126 Other intervertebral disc displacement, lumbar region: Secondary | ICD-10-CM | POA: Diagnosis not present

## 2024-01-14 ENCOUNTER — Ambulatory Visit: Payer: Medicare Other | Attending: Cardiology | Admitting: Cardiology

## 2024-01-14 ENCOUNTER — Encounter: Payer: Self-pay | Admitting: Cardiology

## 2024-01-14 VITALS — BP 110/68 | HR 76 | Ht 70.0 in | Wt 195.0 lb

## 2024-01-14 DIAGNOSIS — R072 Precordial pain: Secondary | ICD-10-CM

## 2024-01-14 DIAGNOSIS — M542 Cervicalgia: Secondary | ICD-10-CM

## 2024-01-14 DIAGNOSIS — Z01812 Encounter for preprocedural laboratory examination: Secondary | ICD-10-CM | POA: Diagnosis not present

## 2024-01-14 MED ORDER — METOPROLOL TARTRATE 100 MG PO TABS: 100.0000 mg | ORAL_TABLET | ORAL | 0 refills | Status: AC

## 2024-01-14 NOTE — Progress Notes (Signed)
 Cardiology Office Note:  .   Date:  01/14/2024  ID:  [id number] DHAWAN, DOB December 31, 1955, MRN [id number] PCP: Tally Joe, MD  [facility] Providers Cardiologist:  None     History of Present Illness: .   Dale Hawkins is a 68 y.o. male Discussed the use of AI scribe software for clinical note transcription with the patient, who gave verbal consent to proceed.  History of Present Illness Dale Hawkins is a 68 year old male who presents with chest pain. He was referred by Dr. Erling Conte for evaluation of chest pain.  He experienced mid-sternal chest pain accompanied by tingling and weakness while waking up at 2 AM. No nausea, vomiting, shortness of breath, or palpitations were present. An ECG performed at the time showed sinus rhythm with no abnormalities, and his heart rate was 64 beats per minute. Since the initial episode, he has not experienced any further chest discomfort or related symptoms.  He denies any prior heart issues. He has a history of chronic intractable cervicalgia and migraines, for which he has been seen by neurology. He is currently taking amlodipine and telmisartan 20 mg. He denies having diabetes, hypertension, or hyperlipidemia, although he mentions his cholesterol was slightly elevated at his last checkup with an LDL of 124 mg/dL.  He is not a smoker and maintains an active lifestyle. He denies the use of alcohol or illicit drugs.  His father had a heart attack three years ago at the age of 50, requiring two stents, and passed away at 36. He denies any family history of heart problems otherwise.       Studies Reviewed: .        Results LABS LDL: 124 mg/dL Creatinine: 1.1 mg/dL Hemoglobin: 40.9 g/dL  DIAGNOSTIC ECG: Sinus rhythm with no other abnormalities (08/2023) Risk Assessment/Calculations:            Physical Exam:   VS:  BP 110/68   Pulse 76   Ht 5\' 10"  (1.778 m)   Wt 195 lb (88.5 kg)   BMI 27.98 kg/m    Wt Readings from Last 3  Encounters:  01/14/24 195 lb (88.5 kg)  10/12/23 194 lb 9.6 oz (88.3 kg)  08/14/23 195 lb 15.8 oz (88.9 kg)    GEN: Well nourished, well developed in no acute distress NECK: No JVD; No carotid bruits CARDIAC: RRR, no murmurs, no rubs, no gallops RESPIRATORY:  Clear to auscultation without rales, wheezing or rhonchi  ABDOMEN: Soft, non-tender, non-distended EXTREMITIES:  No edema; No deformity   ASSESSMENT AND PLAN: .    Assessment and Plan Assessment & Plan Chest Pain Experienced mid-sternal chest pain with tingling and weakness at 2 AM in November 2024. No nausea, vomiting, shortness of breath, or palpitations. ECG showed sinus rhythm, heart rate 64 bpm, and normal cardiac markers, excluding myocardial infarction. Differential diagnosis includes potential coronary artery disease. A coronary CT scan is recommended to evaluate for plaque or stenosis, which may have caused the discomfort. If plaque is found, LDL cholesterol may be lowered to below 70 mg/dL to prevent cardiovascular events. - Order coronary CT scan to evaluate for coronary artery disease. - Review results of coronary CT scan and adjust treatment plan as necessary. - Consider lipid-lowering therapy if coronary CT scan shows plaque.  Hypertension Hypertension is well-controlled with amlodipine 5 and telmisartan 20.  Hyperlipidemia LDL cholesterol is elevated at 124 mg/dL. If coronary CT scan shows plaque, consider lowering LDL to below 70 mg/dL to prevent  cardiovascular events.  Chronic Cervicalgia and Migraine Chronic intractable cervicalgia and migraine managed by neurology.  Follow-up Follow-up will be determined based on the results of the coronary CT scan. - Schedule follow-up based on coronary CT scan results.       Signed, Donato Schultz, MD

## 2024-01-14 NOTE — Patient Instructions (Signed)
 Medication Instructions:  The current medical regimen is effective;  continue present plan and medications.  *If you need a refill on your cardiac medications before your next appointment, please call your pharmacy*  Lab Work: Please have blood work today at [facility] ([facility])  If you have labs (blood work) drawn today and your tests are completely normal, you will receive your results only by: MyChart Message (if you have MyChart) OR A paper copy in the mail If you have any lab test that is abnormal or we need to change your treatment, we will call you to review the results.  Testing/Procedures:   Your cardiac CT will be scheduled at:   [facility] [address] [place], [place] [address] [phone]  Please arrive at the [facility] and Children's Entrance (Entrance C2) of [facility] 30 minutes prior to test start time. You can use the FREE valet parking offered at entrance C (encouraged to control the heart rate for the test)  Proceed to the [facility] Radiology Department (first floor) to check-in and test prep.  All radiology patients and guests should use entrance C2 at [facility], accessed from [facility], even though the hospital's physical address listed is [address]     Please follow these instructions carefully (unless otherwise directed):  An IV will be required for this test and Nitroglycerin will be given.  Hold all erectile dysfunction medications at least 3 days (72 hrs) prior to test. (Ie viagra, cialis, sildenafil, tadalafil, etc)   On the Night Before the Test: Be sure to Drink plenty of water. Do not consume any caffeinated/decaffeinated beverages or chocolate 12 hours prior to your test. Do not take any antihistamines 12 hours prior to your test.  On the Day of the Test: Drink plenty of water until 1 hour prior to the test. Do not eat any food 1 hour prior to test. You may take your regular  medications prior to the test.  Take metoprolol (Lopressor) two hours prior to test. If you take Furosemide/Hydrochlorothiazide/Spironolactone/Chlorthalidone, please HOLD on the morning of the test. Patients who wear a continuous glucose monitor MUST remove the device prior to scanning. FEMALES- please wear underwire-free bra if available, avoid dresses & tight clothing  After the Test: Drink plenty of water. After receiving IV contrast, you may experience a mild flushed feeling. This is normal. On occasion, you may experience a mild rash up to 24 hours after the test. This is not dangerous. If this occurs, you can take Benadryl 25 mg, Zyrtec, Claritin, or Allegra and increase your fluid intake. (Patients taking Tikosyn should avoid Benadryl, and may take Zyrtec, Claritin, or Allegra) If you experience trouble breathing, this can be serious. If it is severe call 911 IMMEDIATELY. If it is mild, please call our office.  We will call to schedule your test 2-4 weeks out understanding that some insurance companies will need an authorization prior to the service being performed.   For more information and frequently asked questions, please visit our website : [email or web address]  For non-scheduling related questions, please contact the cardiac imaging nurse navigator should you have any questions/concerns: Cardiac Imaging Nurse Navigators Direct Office Dial: [phone]   For scheduling needs, including cancellations and rescheduling, please call [place], [phone].   Follow-Up: At [facility], you and your health needs are our priority.  As part of our continuing mission to provide you with exceptional heart care, our providers are all part of one  team.  This team includes your primary Cardiologist (physician) and Advanced Practice Providers or APPs (Physician Assistants and Nurse Practitioners) who all work together to provide you with the care you need, when you need  it.  Your next appointment:   Follow up will be based on the results of the above testing.   We recommend signing up for the patient portal called "MyChart".  Sign up information is provided on this After Visit Summary.  MyChart is used to connect with patients for Virtual Visits (Telemedicine).  Patients are able to view lab/test results, encounter notes, upcoming appointments, etc.  Non-urgent messages can be sent to your provider as well.   To learn more about what you can do with MyChart, go to [email or web address].      1st Floor: - Lobby - Registration  - Pharmacy  - Lab - Cafe  2nd Floor: - PV Lab - Diagnostic Testing (echo, CT, nuclear med)  3rd Floor: - Vacant  4th Floor: - TCTS (cardiothoracic surgery) - AFib Clinic - Structural Heart Clinic - Vascular Surgery  - Vascular Ultrasound  5th Floor: - HeartCare Cardiology (general and EP) - Clinical Pharmacy for coumadin, hypertension, lipid, weight-loss medications, and med management appointments    Valet parking services will be available as well.

## 2024-01-15 LAB — BASIC METABOLIC PANEL WITH GFR
BUN/Creatinine Ratio: 14 (ref 10–24)
BUN: 16 mg/dL (ref 8–27)
CO2: 23 mmol/L (ref 20–29)
Calcium: 9.5 mg/dL (ref 8.6–10.2)
Chloride: 104 mmol/L (ref 96–106)
Creatinine, Ser: 1.13 mg/dL (ref 0.76–1.27)
Glucose: 107 mg/dL — ABNORMAL HIGH (ref 70–99)
Potassium: 4.3 mmol/L (ref 3.5–5.2)
Sodium: 140 mmol/L (ref 134–144)
eGFR: 72 mL/min/{1.73_m2} (ref >59)

## 2024-01-17 ENCOUNTER — Encounter: Payer: Self-pay | Admitting: Cardiology

## 2024-01-26 DIAGNOSIS — R3912 Poor urinary stream: Secondary | ICD-10-CM | POA: Diagnosis not present

## 2024-01-26 DIAGNOSIS — N401 Enlarged prostate with lower urinary tract symptoms: Secondary | ICD-10-CM | POA: Diagnosis not present

## 2024-01-26 DIAGNOSIS — M545 Low back pain, unspecified: Secondary | ICD-10-CM | POA: Diagnosis not present

## 2024-01-31 ENCOUNTER — Telehealth (HOSPITAL_COMMUNITY): Payer: Self-pay | Admitting: *Deleted

## 2024-01-31 NOTE — Telephone Encounter (Signed)
 Attempted to call patient regarding upcoming cardiac CT appointment. Left message on voicemail with name and callback number Johney Frame RN Navigator Cardiac Imaging [facility] [phone] Office

## 2024-02-01 ENCOUNTER — Ambulatory Visit (HOSPITAL_COMMUNITY)
Admission: RE | Admit: 2024-02-01 | Discharge: 2024-02-01 | Disposition: A | Discharge status: Home / Self Care | Source: Ambulatory Visit | Attending: Internal Medicine | Admitting: Internal Medicine

## 2024-02-01 ENCOUNTER — Telehealth: Payer: Self-pay | Admitting: Cardiology

## 2024-02-01 DIAGNOSIS — R072 Precordial pain: Secondary | ICD-10-CM | POA: Insufficient documentation

## 2024-02-01 DIAGNOSIS — I2510 Atherosclerotic heart disease of native coronary artery without angina pectoris: Secondary | ICD-10-CM | POA: Diagnosis not present

## 2024-02-01 MED ORDER — IOHEXOL 350 MG/ML SOLN
100.0000 mL | Freq: Once | INTRAVENOUS | Status: AC | PRN
Start: 2024-02-01 — End: 2024-02-01
  Administered 2024-02-01: 100 mL via INTRAVENOUS

## 2024-02-01 MED ORDER — NITROGLYCERIN 0.4 MG SL SUBL
SUBLINGUAL_TABLET | SUBLINGUAL | Status: AC
  Filled 2024-02-01: qty 2

## 2024-02-01 NOTE — Telephone Encounter (Signed)
 Spoke with patient and he is aware he can take his metoprolol  before his CT. He does not need two tablets. He only needed one tablet.  He verbalized understanding

## 2024-02-01 NOTE — Telephone Encounter (Signed)
 Pt c/o medication issue:  1. Name of Medication:    2. How are you currently taking this medication (dosage and times per day)?    3. Are you having a reaction (difficulty breathing--STAT)? no  4. What is your medication issue? Calling about what medication he is suppose to take prior to his CT. Please advise

## 2024-02-03 DIAGNOSIS — E7800 Pure hypercholesterolemia, unspecified: Secondary | ICD-10-CM | POA: Diagnosis not present

## 2024-02-03 DIAGNOSIS — I10 Essential (primary) hypertension: Secondary | ICD-10-CM | POA: Diagnosis not present

## 2024-02-03 DIAGNOSIS — S30861A Insect bite (nonvenomous) of abdominal wall, initial encounter: Secondary | ICD-10-CM | POA: Diagnosis not present

## 2024-02-03 DIAGNOSIS — R1031 Right lower quadrant pain: Secondary | ICD-10-CM | POA: Diagnosis not present

## 2024-02-08 ENCOUNTER — Encounter: Payer: Self-pay | Admitting: Cardiology

## 2024-02-16 ENCOUNTER — Telehealth (INDEPENDENT_AMBULATORY_CARE_PROVIDER_SITE_OTHER): Payer: Medicare Other | Admitting: Neurology

## 2024-02-16 ENCOUNTER — Ambulatory Visit: Payer: Self-pay | Admitting: *Deleted

## 2024-02-16 DIAGNOSIS — G43711 Chronic migraine without aura, intractable, with status migrainosus: Secondary | ICD-10-CM | POA: Diagnosis not present

## 2024-02-16 NOTE — Progress Notes (Signed)
 [facility]    Provider:  Dr Tresia Fruit Requesting Provider: Rae Bugler, MD Primary Care Provider:  Rae Bugler, MD  CC:  headaches, migraines  Virtual Visit via Video Note  I connected with Dale Hawkins on 02/22/24 at  3:00 PM EDT by a video enabled telemedicine application and verified that I am speaking with the correct person using two identifiers.  Location: Patient: home Provider: office   I discussed the limitations of evaluation and management by telemedicine and the availability of in person appointments. The patient expressed understanding and agreed to proceed.   Follow Up Instructions:    I discussed the assessment and treatment plan with the patient. The patient was provided an opportunity to ask questions and all were answered. The patient agreed with the plan and demonstrated an understanding of the instructions.   The patient was advised to call back or seek an in-person evaluation if the symptoms worsen or if the condition fails to improve as anticipated.  I provided 10 minutes of non-face-to-face time during this encounter.   Glory Larsen, MD   02/16/2024: He retired since he was last seen here. He did not see any improvement with the emgality  but he only tried it once never picked it up from the pharmacy. Retiring has helped. He is going to see how the emgality  works will re-prescribe. He has daily headaches and >15 monthly migrianes. No other focal neurologic deficits, associated symptoms, inciting events or modifiable factors.   10/12/2023: Patient was seen this past July for chronic intractable cervicalgia and was recommended to follow-up for cervical spine injections.  Patient has chronic history over 10 years and is seen other headache doctors diagnosed with cervical spondylosis, longstanding history of headaches on a daily basis followed by a number of specialists over the years, he saw the [facility] Dr. Gilford Labs Dxed  with migraines, he is also been to the [facility] and by his own report he is "tried everything" and the headache is at the posterior aspect of his head he does have a history of cervical surgery and the headaches are centered around the occipital area.  He was also diagnosed with migraines by Dr. Gilford Labs at the [facility] headache center.  He denies migrainous symptoms however no light or sound sensitivity, no pulsating pounding throbbing, no nausea no vomiting and feels like it is coming from his neck.Has not seen any results with any of the medications or procedure he has tried. He has tried a plethora of medications and procedures including  botox in the neck, trigger points as well as ESI and facet injection, massage, dry needling. No signs or symptoms of sleep apnea, ESS is 0. Remained stable for years.   He has an appointment with [facility] tomorrow he had a back injection and a neck injection and he is going back tomorrow for follow up. Patient states his headaches are numb in the back of the head. They are in the back of the head and in the temples. Daily. Since the MRi they have not been as bad. Daily throughout the day. He has daily headaches. A dull pain. Not throbbing/pulsating/pounding. No light sensitivity. No nausea,No sensitivity to light or smells. He has been diagnosed with migraines in the past.   Patient complains of symptoms per HPI as well as the following symptoms: none . Pertinent negatives and positives per HPI. All others negative:  HPI 04/06/2023:  Dale Hawkins is a 68 y.o. male here as  requested by Rae Bugler, MD for headaches.  Patient has at least a 10-year history of headaches and he saw F[place]on Job at the [facility] from 20 13-20 15 (see below).has Cervical spondylosis; Headache; Lumbar foraminal stenosis; and Cervicalgia on their problem list.  He has a past medical history per notes reviewed from Dr. Stefani Edin also including essential  hypertension, history of kidney stones, acute diverticulitis, anxiety, elevated LDL, he had C-spine surgery with Dr. Larrie Po in 2004.  According to Dr. Stefani Edin, he has a longstanding history of headaches on a daily basis and has been followed by a number of specialists over the years for this issue, he also saw Dr. Margie Sheller neurologist in 2010 at which time baclofen and topiramate were recommended, he saw Dr. Noland Battles and reports he has "tried everything", also followed with [facility].  His current headache is described as 3 on a scale of 10 and is bandlike discomfort around the posterior aspect of his head, he did have neck surgery 2004 and has had a few visits with neurosurgery since then, he has an orthopedist he is followed with and they ordered an MRI of the head which was reportedly normal, he typically wakes with a headache every morning which is tight in his occipital region bilaterally.  Diagnosed with chronic daily headaches.  He was diagnosed with migraines by Dr. Gilford Labs at the [facility] headache center.   Patient is here and reports Occipital headaches and in th neck with stiffness.s/p ACDF with Dr. Larrie Po. Ongoing for about 20 years off and on. Numb headache coming up from the cervical spine to the back part of the head and rarely in the temples bilaterally. Dull headache. No light or sound sensitivity, no pulsating/pounding/throbbing, no nausea, no vomiting, feels like it is coming from his neck. He has tried trigger point and botox and epidural steroid injections. He wakes up every morning with a headache. Mild headaches. He doesn't take anything, rarely and advil  and tylenol  and tried muscle relaxers, Has not seen any results with any of the medications or procedure he has tried. He has tried a plethora of medications and procedures including  botox in the neck, trigger points as well as ESI and facet injection, massage, dry needling. No signs or symptoms of sleep apnea, ESS is 0. Remained  stable for years.    I reviewed Dr. Terral Ferrari notes 06/19/2014 and last seen in 3/206 by [facility] diagnosed with migraines: Per Lawson Prey notes, MRI in 2013 and 2015 normal.  He was headache patient at the [facility] (see one of her notes below) for many years from 2013 - 2016, her neurologic exams have been normal, he was also seen by orthopedics for his neck, per notes patient's had daily headaches for many years and the problem has been gradually worsening the pain when she saw him was located in the occipital region and radiates to the left neck and right neck, dull pressure, associated symptoms neck pain but no blurred vision dizziness nausea phonophobia photophobia vomiting or weakness.  Aggravated by emotional stress.   From a thorough review of records, medications tried > 3 months that can be used in headache/migraine management include: OxyContin , Sansert, Benadryl, Nasonex, Aleve, Celebrex, Flexeril, baclofen, Skelaxin, Zanaflex, Klonopin, Topamax, Effexor, Zoloft, Cymbalta, Seroquel, Zyprexa, Tylenol , amlodipine (calcium  channel blocker for blood pressure), ketorolac  injections, Zofran , nortriptyline.,  Memantine.  Telmisartan, botox in the neck, trigger points as well as ESI and facet injection, massage, dry needling. Aimovig contraindicated due  to constipation.   Reviewed notes, labs and imaging from outside physicians, which showed:  Cervical myelogram 10/2010:   CERVICAL MYELOGRAM  Lumbar puncture injection with Omnipaque  contrast was performed by Dr. Larrie Po  There has been anterior plate and interbody fusion of C5, C6, and C7.  This appears to be a solid bony fusion at both levels.  There is mild retrolisthesis of C4 and C5 with a mild anterior axial defect due to spurring.  There is also mild anterior defect at C3-4.    IMPRESSION:   Satisfactory anterior fusion of C5, C6, and C7.   Mild anterior defects of C3-4 and C4-5.    CT MYELOGRAM CERVICAL SPINE  Entire  cervical spine was scanned with multiplanar reconstruction performed in the sagittal and coronal planes with no comparison.   The alignment appears normal on the reconstructed images.  There has been anterior plate fusion of C5, C6, and C7 with the hardware in good position.  There appears to be solid bony fusion at both levels.   C2-3:  Negative.   C3-4:  Negative.   C4-5:  There is disk bulging and spondylosis with posterior osteophyte formation, right greater than left, effacing the anterior subarachnoid space and causing mild flattening of the cord on the right.  There is very mild right foraminal narrowing.  C5-6:  Satisfactory fusion level without stenosis.  C6-7:  Satisfactory fusion level without stenosis.  There is some right foraminal narrowing due to osteophyte.  Canal is patent with a small central osteophyte projecting ventrally.   C7-T1:  Negative.   IMPRESSION:   Satisfactory anterior fusion of C5, C6, and C7.   Disk degeneration and spondylosis, right side predominant, at C4-5 with mild flattening of the cord on the right.  Mild right foraminal encroachment.   Christine Hagan's Note from 2015 HPI  Patient comes in for follow up of headaches This is a chronic problem. Episode onset: Patient with daily headache for many years. The problem occurs daily. The problem has been gradually worsening (At times feeling sick with headaches). The pain is located in the occipital region. The pain radiates to the left neck and right neck. The quality of the pain is described as dull (pressure). The pain is severe. Associated symptoms include neck pain. Pertinent negatives include no blurred vision, dizziness, nausea, phonophobia, photophobia, vomiting or weakness. The symptoms are aggravated by emotional stress.   Followed patient for many years. Tried multiple preventatives without improvement. A few headaches more severe than his usual headache. Comes in for follow up today.   Seen by Ortho to  see if his neck may be playing a role.   Current and past medications: ANALGESICS: Oxycontin   ANTI-MIGRAINE: Sansert  HEART/BP:  DECONGESTANT/ANTIHISTAMINE: Benadryl,Nasonex  ANTI-NAUSEANT  NSAIDS: Aleve,Celebrex  MUSCLE RELAXANTS: Flexeril,Baclofen,Skelaxin,Zanaflex  ANTI-CONVULSANTS: Klonopin,Topamax  STEROIDS:  SLEEPING PILLS/TRANQUILIZERS: Ambien,Seroquel,Tylenol  PM,Zyprexa  ANTI-DEPRESSANTS: Cymbalta,Effexor,Zoloft, nortriptyline HERBAL:  FIBROMYALGIA: Cymbalta  HORMONAL:  OTHER:  PROCEDURES FOR HEADACHES: TPI  The patient's allergies, current medications, past family history, past medical history, past social history, past surgical history and problem list were reviewed and updated as appropriate.   Review of Systems  Musculoskeletal: Positive for neck stiffness.  Neurological: Positive for dizziness and headaches.  All other systems reviewed and are negative.   Objective:  Neurologic Exam   Mental Status  Oriented to person, place, and time.  Attention: normal. Concentration: normal.  Level of consciousness: alert Knowledge: consistent with education.   Cranial Nerves   CN III, IV, VI  Pupils are equal, round, and reactive to light.  CN VIII  Hearing: intact  Motor Exam  Muscle bulk: normal Overall muscle tone: normal  Gait, Coordination, and Reflexes   Gait Gait: normal  Physical Exam  Constitutional: He is oriented to person, place, and time. He appears well-developed and well-nourished.  HENT:  Head: Normocephalic and atraumatic.  Eyes: Pupils are equal, round, and reactive to light.  Cardiovascular: Normal rate, regular rhythm and normal heart sounds.  Pulmonary/Chest: Effort normal and breath sounds normal.  Neurological: He is oriented to person, place, and time. Gait normal.    Assessment / Plan:   Assessment 1. Chronic migraine without aura, without mention of intractable migraine without mention of status migrainosus     Plan Chronic migraine. Recent MRI in 2013 and 2015 normal. Trial of nortriptyline. Follow up in six months.  Patient's Medications  New Prescriptions  NORTRIPTYLINE (PAMELOR) 10 MG CAPSULE Take 3 capsules (30 mg total) by mouth at bedtime.  Previous Medications  MECLIZINE HCL (ANTIVERT) 25 MG TABLET  Modified Medications  No medications on file  Discontinued Medications  ACETAMINOPHEN  (TYLENOL ) 500 MG TABLET Take 500 mg by mouth every 6 (six) hours as needed.  CHLORZOXAZONE 750 MG TABS Take 750 mg by mouth 3 (three) times a day as needed.  MEMANTINE HCL ER 28 MG CP24 Take 28 mg by mouth daily.      Latest Ref Rng & Units 01/14/2024    2:29 PM 08/14/2023    4:27 AM 03/18/2020   11:16 AM  CMP  Glucose 70 - 99 mg/dL 914  782  956   BUN 8 - 27 mg/dL 16  21  20    Creatinine 0.76 - 1.27 mg/dL 2.13  0.86  5.78   Sodium 134 - 144 mmol/L 140  136  138   Potassium 3.5 - 5.2 mmol/L 4.3  4.0  3.9   Chloride 96 - 106 mmol/L 104  106  104   CO2 20 - 29 mmol/L 23  23  24    Calcium  8.6 - 10.2 mg/dL 9.5  8.9  8.7   Total Protein 6.5 - 8.1 g/dL   7.2   Total Bilirubin 0.3 - 1.2 mg/dL   1.2   Alkaline Phos 38 - 126 U/L   92   AST 15 - 41 U/L   19   ALT 0 - 44 U/L   20       Latest Ref Rng & Units 08/14/2023    4:27 AM 03/18/2020   11:16 AM 02/01/2017    3:58 AM  CBC  WBC 4.0 - 10.5 K/uL 8.6  13.4  10.6   Hemoglobin 13.0 - 17.0 g/dL 46.9  62.9  52.8   Hematocrit 39.0 - 52.0 % 40.8  43.8  43.9   Platelets 150 - 400 K/uL 290  226  241      Review of Systems: Patient complains of symptoms per HPI as well as the following symptoms neck pain. Pertinent negatives and positives per HPI. All others negative.   Social History   Socioeconomic History   Marital status: Married    Spouse name: Not on file   Number of children: Not on file   Years of education: Not on file   Highest education level: Not on file  Occupational History   Not on file  Tobacco Use   Smoking status: Never    Smokeless tobacco: Never  Vaping Use   Vaping status: Never Used  Substance and  Sexual Activity   Alcohol use: No   Drug use: No   Sexual activity: Yes    Partners: Female    Birth control/protection: None  Other Topics Concern   Not on file  Social History Narrative   Caffiene occ (dr pepper)   Working: transportation Field seismologist.    Social Drivers of Corporate investment banker Strain: Low Risk  (09/14/2021)   Received from [facility], [facility]   Overall Financial Resource Strain (CARDIA)    Difficulty of Paying Living Expenses: Not hard at all  Food Insecurity: No Food Insecurity (09/14/2021)   Received from [facility], [facility]   Hunger Vital Sign    Worried About Running Out of Food in the Last Year: Never true    Ran Out of Food in the Last Year: Never true  Transportation Needs: No Transportation Needs (09/14/2021)   Received from [facility], [facility]   PRAPARE - Transportation    Lack of Transportation (Medical): No    Lack of Transportation (Non-Medical): No  Physical Activity: Sufficiently Active (09/14/2021)   Received from [facility], [facility]   Exercise Vital Sign    Days of Exercise per Week: 5 days    Minutes of Exercise per Session: 30 min  Stress: No Stress Concern Present (09/14/2021)   Received from [facility], [facility] - Occupational Stress Questionnaire    Feeling of Stress : Only a little  Social Connections: Unknown (02/15/2022)   Received from [facility], [facility]   Social Network    Social Network: Not on file  Intimate Partner Violence: Unknown (01/07/2022)   Received from [facility], [facility]   HITS    Physically Hurt: Not on file    Insult or Talk Down To: Not on file    Threaten Physical Harm: Not on file    Scream or Curse: Not on file    Family History  Problem Relation Age of Onset   Cancer Mother    ADD / ADHD Son    ADD /  ADHD Son    Migraines Neg Hx     Past Medical History:  Diagnosis Date   Anxiety    Depression    Diverticulitis    Hypertension    Kidney stone     Patient Active Problem List   Diagnosis Date Noted   Cervicalgia 04/06/2023   Cervical spondylosis 01/12/2023   Lumbar foraminal stenosis 01/12/2023   Headache 04/22/2012    Past Surgical History:  Procedure Laterality Date   CYST REMOVAL TRUNK     NECK SURGERY  2004    Current Outpatient Medications  Medication Sig Dispense Refill   Galcanezumab -gnlm (EMGALITY ) 120 MG/ML SOAJ Inject 120 mg into the skin every 30 (thirty) days. 1.12 mL 11   ALPRAZolam  (XANAX ) 0.25 MG tablet 1  qhs as needed 25 tablet 1   amLODipine (NORVASC) 5 MG tablet Take 5 mg by mouth daily.     metoprolol  tartrate (LOPRESSOR ) 100 MG tablet Take 1 tablet (100 mg total) by mouth as directed. Take one tablet (2) hours before your CT scan 1 tablet 0   rosuvastatin  (CRESTOR ) 10 MG tablet Take 1 tablet (10 mg total) by mouth daily. 90 tablet 3   telmisartan (MICARDIS) 20 MG tablet Take 20 mg by mouth daily.     No current facility-administered medications for this visit.    Allergies as of 02/16/2024   (No Known  Allergies)    Vitals: There were no vitals taken for this visit. Last Weight:  Wt Readings from Last 1 Encounters:  01/14/24 195 lb (88.5 kg)   Last Height:   Ht Readings from Last 1 Encounters:  01/14/24 5\' 10"  (1.778 m)   Physical exam: Exam: Gen: NAD, conversant      CV: No palpitations or chest pain or SOB. VS: Breathing at a normal rate. Weight appears overweight. Not febrile. Eyes: Conjunctivae clear without exudates or hemorrhage  Neuro: Detailed Neurologic Exam  Speech:    Speech is normal; fluent and spontaneous with normal comprehension.  Cognition:    The patient is oriented to person, place, and time;     recent and remote memory intact;     language fluent;     normal attention, concentration, fund of  knowledge Cranial Nerves:    The pupils are equal, round, and reactive to light. Visual fields are full Extraocular movements are intact.  The face is symmetric with normal sensation. The palate elevates in the midline. Hearing intact. Voice is normal. Shoulder shrug is normal. The tongue has normal motion without fasciculations.   Coordination: normal  Gait:    No abnormalities noted or reported  Motor Observation:   no involuntary movements noted. Tone:    Appears normal  Posture:    Posture is normal. normal erect    Strength:    Strength is anti-gravity and symmetric in the upper and lower limbs.      Sensation: intact to LT, no reports of numbness or tingling or paresthesias           Assessment/Plan:  Patient is here and reports chronic migraines (daily for years) Also Occipital headaches from the neck with stiffness.s/p ACDF with Dr. Larrie Po. Ongoing for about 20 years off and on cervicalgia. Patient was seen this past July for chronic intractable cervicalgia and was recommended to follow-up for cervical spine injections.  Patient has chronic history over 10 years and is seen other headache doctors diagnosed with cervical spondylosis, longstanding history of headaches on a daily basis followed by a number of specialists over the years, he saw the [facility] Dr. Gilford Labs diagnosis migraines he has also been to the [facility] and by his own report he is "tried everything" and the headache is at the posterior aspect of his head he does have a history of cervical surgery and the headaches are centered around the occipital area.  He was also diagnosed with migraines by Dr. Gilford Labs at the [facility] headache center and reports daily migraines.  He denies migrainous symptoms however no light or sound sensitivity, no pulsating pounding throbbing, no nausea no vomiting and feels like it is coming from his neck.Has not seen any results with any of the medications or  procedure he has tried. He has tried a plethora of medications and procedures including  botox in the neck, trigger points as well as ESI and facet injection, massage, dry needling. No signs or symptoms of sleep apnea, ESS is 0. Remained stable for years. Daily headaches. > 15 total migraine days a month for years  Can try emgality  for prevention - he did not take the injections he never heard from the insurance or the pharmacy will reorder  From a thorough review of records, medications tried > 3 months that can be used in headache/migraine management include: OxyContin , Sansert, Benadryl, Nasonex, Aleve, Celebrex, Flexeril, baclofen, Skelaxin, Zanaflex, Klonopin, Topamax, depakote, Effexor, Zoloft, Cymbalta, Seroquel, Zyprexa, Tylenol ,  amlodipine (calcium  channel blocker for blood pressure), ketorolac  injections, Zofran , nortriptyline.,  Memantine.  Telmisartan, botox in the neck, trigger points as well as ESI and facet injection, massage, dry needling. Aimovig contraindicated due to constipation.   No orders of the defined types were placed in this encounter.  Meds ordered this encounter  Medications   Galcanezumab -gnlm (EMGALITY ) 120 MG/ML SOAJ    Sig: Inject 120 mg into the skin every 30 (thirty) days.    Dispense:  1.12 mL    Refill:  11    Cc: Rae Bugler, MD,  Rae Bugler, MD  Aldona Amel, MD  [facility] [address] [place], [place] [address]  Phone [phone] Fax [phone]  I spent over 30 minutes of face-to-face and non-face-to-face time with patient on the  1. Chronic migraine without aura, with intractable migraine, so stated, with status migrainosus     diagnosis.  This included previsit chart review, lab review, study review, order entry, electronic health record documentation, patient education on the different diagnostic and therapeutic options, counseling and coordination of care, risks and benefits of management,  compliance, or risk factor reduction

## 2024-02-17 ENCOUNTER — Telehealth: Payer: Self-pay | Admitting: Cardiology

## 2024-02-17 DIAGNOSIS — M5416 Radiculopathy, lumbar region: Secondary | ICD-10-CM | POA: Diagnosis not present

## 2024-02-17 NOTE — Telephone Encounter (Signed)
 Spoke with pt about results. Pt does not want to go on crestor. Pt asked that if he was strict on his diet could he wait the month/ month in a half until his PCP appointment and have has labs done again to see if they improve with dietary intervention. I told him I felt like Dr Renna Cary would be ok with that plan and to just make sure we received the labs for him to review and we could go from them. Advised pt would would call him back if DrSkains felt like he needed to go on medication sooner. Pt stated understanding.

## 2024-02-17 NOTE — Telephone Encounter (Signed)
Pt returning call in regards to results. Please advise 

## 2024-02-18 NOTE — Telephone Encounter (Signed)
 Hugh Madura, MD  You; Veldon German, RN12 hours ago (9:52 PM)    Thanks for update    No new orders given at this time.

## 2024-02-21 MED ORDER — ROSUVASTATIN CALCIUM 10 MG PO TABS: 10.0000 mg | ORAL_TABLET | Freq: Every day | ORAL | 3 refills | Status: AC

## 2024-02-21 NOTE — Addendum Note (Signed)
 Addended by: Thaddeus Filippo on: 02/21/2024 04:51 PM   Modules accepted: Orders

## 2024-02-21 NOTE — Telephone Encounter (Signed)
 Patient was returning call. Please advise ?

## 2024-02-22 ENCOUNTER — Encounter: Payer: Self-pay | Admitting: Neurology

## 2024-02-22 MED ORDER — EMGALITY 120 MG/ML ~~LOC~~ SOAJ: 120.0000 mg | SUBCUTANEOUS | 11 refills | Status: AC

## 2024-02-23 NOTE — Telephone Encounter (Signed)
Attempted to reach patient. No answer. Left message to call back.   

## 2024-02-24 DIAGNOSIS — R972 Elevated prostate specific antigen [PSA]: Secondary | ICD-10-CM | POA: Diagnosis not present

## 2024-02-29 DIAGNOSIS — R972 Elevated prostate specific antigen [PSA]: Secondary | ICD-10-CM | POA: Diagnosis not present

## 2024-03-08 DIAGNOSIS — N401 Enlarged prostate with lower urinary tract symptoms: Secondary | ICD-10-CM | POA: Diagnosis not present

## 2024-03-13 ENCOUNTER — Telehealth: Payer: Self-pay | Admitting: Cardiology

## 2024-03-13 MED ORDER — NITROGLYCERIN 0.4 MG SL SUBL: 0.4000 mg | SUBLINGUAL_TABLET | SUBLINGUAL | 4 refills | Status: AC | PRN

## 2024-03-13 NOTE — Telephone Encounter (Signed)
 Patient returned RN's call.

## 2024-03-13 NOTE — Telephone Encounter (Signed)
Left another message for the pt to call back.  

## 2024-03-13 NOTE — Telephone Encounter (Signed)
 Returned call to patient- he states he had an incident this morning while out working in the heat. No incidents since. Advised on nitroglycerin  use and ED precautions. Pt scheduled 6/20 at [facility] office with Dr. Renna Cary.

## 2024-03-13 NOTE — Telephone Encounter (Signed)
 Left a message for the pt to call back.     Jann Melody, MD  Alanna Alley, RN Cc: [facility] Triage Caller: Unspecified (Today,  8:13 AM) Chart reviewed - patient has hx of minimal non-obstructive CAD with zero calcium  score who presents with a different CP that his index evaluation - He is planned for f/u by Dr. Annabell Baron team in 2 weeks - obstructive CAD is not described by nursing report - would give PRN nitroglycerin  as per protocol - would give ED precautions - agree with f/u evaluation by Dr. Annabell Baron team as planned by nurse note  Gloriann Larger, MD FASE [facility] Cardiologist [facility] [address] [place], [place] [address] [phone] 12:35 PM

## 2024-03-13 NOTE — Telephone Encounter (Signed)
 Pt c/o of Chest Pain: STAT if active (IN THIS MOMENT) CP, including tightness, pressure, jaw pain, shoulder/upper arm/back pain, SOB, nausea, and vomiting.  1. Are you having CP right now (tightness, pressure, or discomfort)?  No. Chest tightness only. No pain.  2. Are you experiencing any other symptoms (ex. SOB, nausea, vomiting, sweating)?  Left arm weakness, SOB with exertion   3. How long have you been experiencing CP?  Past 3-4 days   4. Is your CP continuous or coming and going?  Coming and going  5. Have you taken Nitroglycerin ?  Patient says he has never taken nitroglycerin 

## 2024-03-13 NOTE — Telephone Encounter (Signed)
 Patient returned call

## 2024-03-13 NOTE — Telephone Encounter (Signed)
 Pt called to report that he has been having chest tightness and SOB with some exertion and and rest... it is intermittent and has been happening for about a week... no edema, no cough, no pain with breathing.   He had a calcium  score of zero with some non-obstructive plaque on 4/29.Dale Hawkins e saw Dr Renna Cary 4/11 with chest pain but he says this is different since now he has some SOB with it.   He has not had pain today. I am unable to find him an appt in the next 2 weeks... he will go to the ED if anything worsens.   I will send to our DOD since DR Renna Cary is out of the office to see if appropriate to plan any testing and have the pt see Dr Renna Cary thereafter or any recommendations.

## 2024-03-13 NOTE — Telephone Encounter (Signed)
 Left a message for the pt to call back.   Last OV 01/14/24 Ca Score 01/22/24

## 2024-03-15 DIAGNOSIS — R634 Abnormal weight loss: Secondary | ICD-10-CM | POA: Diagnosis not present

## 2024-03-15 DIAGNOSIS — I10 Essential (primary) hypertension: Secondary | ICD-10-CM | POA: Diagnosis not present

## 2024-03-15 DIAGNOSIS — E7800 Pure hypercholesterolemia, unspecified: Secondary | ICD-10-CM | POA: Diagnosis not present

## 2024-03-23 NOTE — Telephone Encounter (Signed)
 See phone note from 03/13/24. Pt phone call returned and issues addressed.

## 2024-03-24 ENCOUNTER — Ambulatory Visit (HOSPITAL_BASED_OUTPATIENT_CLINIC_OR_DEPARTMENT_OTHER): Admitting: Cardiology

## 2024-03-24 ENCOUNTER — Encounter (HOSPITAL_BASED_OUTPATIENT_CLINIC_OR_DEPARTMENT_OTHER): Payer: Self-pay | Admitting: Cardiology

## 2024-03-24 VITALS — BP 110/78 | HR 67 | Ht 70.0 in | Wt 183.8 lb

## 2024-03-24 DIAGNOSIS — R072 Precordial pain: Secondary | ICD-10-CM | POA: Diagnosis not present

## 2024-03-24 NOTE — Patient Instructions (Addendum)
 Medication Instructions:  Try to restart Crestor  at 5 mg once a week. Then try increasing a day each week.  If muscle pain occurs, discontinue for 2 weeks then restart again. Continue all other medications as listed.  *If you need a refill on your cardiac medications before your next appointment, please call your pharmacy*   Follow-Up: At [facility], you and your health needs are our priority.  As part of our continuing mission to provide you with exceptional heart care, our providers are all part of one team.  This team includes your primary Cardiologist (physician) and Advanced Practice Providers or APPs (Physician Assistants and Nurse Practitioners) who all work together to provide you with the care you need, when you need it.  Your next appointment:   Follow up as needed with Dr Renna Cary   We recommend signing up for the patient portal called MyChart.  Sign up information is provided on this After Visit Summary.  MyChart is used to connect with patients for Virtual Visits (Telemedicine).  Patients are able to view lab/test results, encounter notes, upcoming appointments, etc.  Non-urgent messages can be sent to your provider as well.   To learn more about what you can do with MyChart, go to [email or web address].

## 2024-03-28 NOTE — Progress Notes (Signed)
 Cardiology Office Note:  .   Date:  03/28/2024  ID:  Dale Hawkins, DOB 05/20/1956, MRN [id number] PCP: Seabron Lenis, MD  [facility] Providers Cardiologist:  None    History of Present Illness: .   Dale Hawkins is a 68 y.o. male Discussed the use of AI scribe software for clinical note transcription with the patient, who gave verbal consent to proceed.  History of Present Illness Dale Hawkins is a 68 year old male who presents with chest pain.  He experiences chest pain during episodes of working outside in the heat, particularly when wearing a suit and tie, which is not his usual attire. The pain is accompanied by dizziness, tightness, and tiredness in his arms. These episodes occur in temperatures ranging from 85 to 90 degrees Fahrenheit. He also notes occasional dizziness when standing up from a squatting position.  A coronary CT scan in April 2025 showed a calcium  score of zero and nonobstructive coronary disease with minimal stenosis in the mid LAD and proximal D1, ranging from zero to twenty-four percent.  He is concerned about weight loss, initially believing he lost about ten pounds, but later determining it was approximately six pounds. 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  He has not started taking Crestor  due to concerns about side effects and a family history of intolerance to the medication. He is attempting to manage his cholesterol through diet and exercise, including increasing his intake of fruits and vegetables and reducing soda consumption.  His recent blood work showed an A1c of 5.6. He is monitoring his weight and maintaining regular physical activity.  He has a history of headaches and has been to a headache clinic in the past. He does not sleep well at night, waking up two to three times, and has been reading about the potential benefits of magnesium supplementation for sleep  and headaches. He has started taking magnesium again, which previously upset his stomach, but he reports no current issues with it.  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      Studies Reviewed: SABRA   EKG Interpretation Date/Time:  Friday March 24 2024 14:15:33 EDT Ventricular Rate:  67 PR Interval:  166 QRS Duration:  94 QT Interval:  406 QTC Calculation: 429 R Axis:   20  Text Interpretation: Normal sinus rhythm Normal ECG When compared with ECG of 14-Aug-2023 04:11, PREVIOUS ECG IS PRESENT Confirmed by Jeffrie Anes ([id number]) on 03/24/2024 2:43:52 PM    Results LABS A1c: 5.6%  RADIOLOGY Coronary CT: Calcium  score of 0, nonobstructive coronary artery disease with minimal stenosis in the mid LAD and proximal D1 (0-24%) (01/2024)  DIAGNOSTIC EKG: Normal Risk Assessment/Calculations:           Physical Exam:   VS:  BP 110/78   Pulse 67   Ht 5' 10 (1.778 m)   Wt 183 lb 12.8 oz (83.4 kg)   SpO2 97%   BMI 26.37 kg/m    Wt Readings from Last 3 Encounters:  03/24/24 183 lb 12.8 oz (83.4 kg)  01/14/24 195 lb (88.5 kg)  10/12/23 194 lb 9.6 oz (88.3 kg)    GEN: Well nourished, well developed in no acute distress NECK: No JVD; No carotid bruits CARDIAC: RRR, no murmurs, no  rubs, no gallops RESPIRATORY:  Clear to auscultation without rales, wheezing or rhonchi  ABDOMEN: Soft, non-tender, non-distended EXTREMITIES:  No edema; No deformity   ASSESSMENT AND PLAN: .    Assessment and Plan Assessment & Plan Chest Pain Intermittent chest pain episodes during exertion in heat, accompanied by dizziness and arm tightness. Coronary CT scan revealed a calcium  score of zero and nonobstructive coronary disease with minimal stenosis in the mid LAD and proximal D1 (0-24%). Symptoms likely due to exertion and heat exposure rather than significant coronary artery  disease.  Hyperlipidemia Hyperlipidemia with LDL of 124 mg/dL. Coronary CT scan showed minimal plaque. Discussed benefits of initiating Crestor  (rosuvastatin ) to lower LDL to target range (~70 mg/dL) given minimal plaque. He expressed concerns about side effects due to family history of intolerance. Discussed gradual introduction of Crestor  to monitor for side effects, starting with a low dose and gradually increasing to assess tolerance. - Start Crestor  at 5 mg once a week, then increase to twice a week if tolerated.  Can continue with increasing dosage as long as he is not having side effects. - Monitor for side effects such as myalgia. - If side effects occur, discontinue Crestor  and reassess after two weeks. - Consider restarting Crestor  if side effects resolve.  If necessary, will refer to lipid clinic.  Pre-diabetes Pre-diabetes with A1c of 5.6%. Encouraged lifestyle modifications including diet and exercise to manage blood glucose levels. - Continue lifestyle modifications including diet and exercise.           Signed, Oneil Parchment, MD

## 2024-04-04 DIAGNOSIS — Z0000 Encounter for general adult medical examination without abnormal findings: Secondary | ICD-10-CM | POA: Diagnosis not present

## 2024-04-04 DIAGNOSIS — E7800 Pure hypercholesterolemia, unspecified: Secondary | ICD-10-CM | POA: Diagnosis not present

## 2024-04-04 DIAGNOSIS — I10 Essential (primary) hypertension: Secondary | ICD-10-CM | POA: Diagnosis not present

## 2024-04-04 DIAGNOSIS — I2510 Atherosclerotic heart disease of native coronary artery without angina pectoris: Secondary | ICD-10-CM | POA: Diagnosis not present

## 2024-04-04 DIAGNOSIS — R7303 Prediabetes: Secondary | ICD-10-CM | POA: Diagnosis not present

## 2024-04-10 DIAGNOSIS — M5412 Radiculopathy, cervical region: Secondary | ICD-10-CM | POA: Diagnosis not present

## 2024-04-20 DIAGNOSIS — L0390 Cellulitis, unspecified: Secondary | ICD-10-CM | POA: Diagnosis not present

## 2024-05-29 DIAGNOSIS — M5416 Radiculopathy, lumbar region: Secondary | ICD-10-CM | POA: Diagnosis not present

## 2024-05-29 DIAGNOSIS — M542 Cervicalgia: Secondary | ICD-10-CM | POA: Diagnosis not present

## 2024-05-29 DIAGNOSIS — M5412 Radiculopathy, cervical region: Secondary | ICD-10-CM | POA: Diagnosis not present

## 2024-07-11 ENCOUNTER — Telehealth: Payer: Self-pay | Admitting: Neurology

## 2024-07-11 NOTE — Telephone Encounter (Signed)
 Called and left voicemail for patient to reschedule appointment on 09/06/24 with Dr Ines.  If patient calls back, they can be rescheduled with Dr. Margaret

## 2024-08-03 DIAGNOSIS — R972 Elevated prostate specific antigen [PSA]: Secondary | ICD-10-CM | POA: Diagnosis not present

## 2024-08-08 DIAGNOSIS — R972 Elevated prostate specific antigen [PSA]: Secondary | ICD-10-CM | POA: Diagnosis not present

## 2024-08-08 DIAGNOSIS — N200 Calculus of kidney: Secondary | ICD-10-CM | POA: Diagnosis not present

## 2024-08-16 DIAGNOSIS — M7541 Impingement syndrome of right shoulder: Secondary | ICD-10-CM | POA: Diagnosis not present

## 2024-08-16 DIAGNOSIS — M7501 Adhesive capsulitis of right shoulder: Secondary | ICD-10-CM | POA: Diagnosis not present

## 2024-08-29 DIAGNOSIS — M47816 Spondylosis without myelopathy or radiculopathy, lumbar region: Secondary | ICD-10-CM | POA: Diagnosis not present

## 2024-08-29 DIAGNOSIS — Z6826 Body mass index (BMI) 26.0-26.9, adult: Secondary | ICD-10-CM | POA: Diagnosis not present

## 2024-09-04 NOTE — Telephone Encounter (Signed)
 Pt has called back, he was offered the next available with wait list

## 2024-09-06 ENCOUNTER — Ambulatory Visit: Admitting: Neurology

## 2024-09-13 DIAGNOSIS — M47816 Spondylosis without myelopathy or radiculopathy, lumbar region: Secondary | ICD-10-CM | POA: Diagnosis not present

## 2024-09-21 DIAGNOSIS — J069 Acute upper respiratory infection, unspecified: Secondary | ICD-10-CM | POA: Diagnosis not present

## 2024-10-31 ENCOUNTER — Telehealth: Payer: Self-pay

## 2024-10-31 NOTE — Telephone Encounter (Signed)
"  ° °  Pre-operative Risk Assessment    Patient Name: Dale Hawkins  DOB: 1955/11/15 MRN: [id number]   Date of last office visit: 03/24/24 Date of next office visit: Not scheduled   Request for Surgical Clearance    Procedure:  right shoulder scope rotator cuff repair  Date of Surgery:  Clearance 11/13/24                                Surgeon:  Dr. Bonner Hair Surgeon's Group or Practice Name:  [facility] Phone number:  [phone] Fax number:  [phone]   Type of Clearance Requested:   - Medical    Type of Anesthesia:  General  and Interscalene block   Additional requests/questions:    SignedIval LOISE Collet   10/31/2024, 3:30 PM   "

## 2024-11-01 ENCOUNTER — Telehealth (HOSPITAL_BASED_OUTPATIENT_CLINIC_OR_DEPARTMENT_OTHER): Payer: Self-pay | Admitting: *Deleted

## 2024-11-01 NOTE — Telephone Encounter (Signed)
 Pt has been scheduled tele preop appt 11/07/24. Med rec and consent are done.     Patient Consent for Virtual Visit        Dale Hawkins has provided verbal consent on 11/01/2024 for a virtual visit (video or telephone).   CONSENT FOR VIRTUAL VISIT FOR:  Dale Hawkins  By participating in this virtual visit I agree to the following:  I hereby voluntarily request, consent and authorize [facility] and its employed or contracted physicians, physician assistants, nurse practitioners or other licensed health care professionals (the Practitioner), to provide me with telemedicine health care services (the Services) as deemed necessary by the treating Practitioner. I acknowledge and consent to receive the Services by the Practitioner via telemedicine. I understand that the telemedicine visit will involve communicating with the Practitioner through live audiovisual communication technology and the disclosure of certain medical information by electronic transmission. I acknowledge that I have been given the opportunity to request an in-person assessment or other available alternative prior to the telemedicine visit and am voluntarily participating in the telemedicine visit.  I understand that I have the right to withhold or withdraw my consent to the use of telemedicine in the course of my care at any time, without affecting my right to future care or treatment, and that the Practitioner or I may terminate the telemedicine visit at any time. I understand that I have the right to inspect all information obtained and/or recorded in the course of the telemedicine visit and may receive copies of available information for a reasonable fee.  I understand that some of the potential risks of receiving the Services via telemedicine include:  Delay or interruption in medical evaluation due to technological equipment failure or disruption; Information transmitted may not be sufficient (e.g. poor  resolution of images) to allow for appropriate medical decision making by the Practitioner; and/or  In rare instances, security protocols could fail, causing a breach of personal health information.  Furthermore, I acknowledge that it is my responsibility to provide information about my medical history, conditions and care that is complete and accurate to the best of my ability. I acknowledge that Practitioner's advice, recommendations, and/or decision may be based on factors not within their control, such as incomplete or inaccurate data provided by me or distortions of diagnostic images or specimens that may result from electronic transmissions. I understand that the practice of medicine is not an exact science and that Practitioner makes no warranties or guarantees regarding treatment outcomes. I acknowledge that a copy of this consent can be made available to me via my patient portal [facility] MyChart), or I can request a printed copy by calling the office of [facility].    I understand that my insurance will be billed for this visit.   I have read or had this consent read to me. I understand the contents of this consent, which adequately explains the benefits and risks of the Services being provided via telemedicine.  I have been provided ample opportunity to ask questions regarding this consent and the Services and have had my questions answered to my satisfaction. I give my informed consent for the services to be provided through the use of telemedicine in my medical care

## 2024-11-01 NOTE — Telephone Encounter (Signed)
 Pt has been scheduled tele preop appt 11/07/24. Med rec and consent are done.

## 2024-11-01 NOTE — Telephone Encounter (Signed)
" ° °  Name: Dale Hawkins  DOB: 1955-10-17  MRN: [id number]  Primary Cardiologist: None   Preoperative team, please contact this patient and set up a phone call appointment for further preoperative risk assessment. Please obtain consent and complete medication review. Thank you for your help.  I confirm that guidance regarding antiplatelet and oral anticoagulation therapy has been completed and, if necessary, noted below. - N/A  I also confirmed the patient resides in the state of [place] . As per [facility] Medical Board telemedicine laws, the patient must reside in the state in which the provider is licensed.   Kaede Clendenen E Adahlia Stembridge, PA-C 11/01/2024, 9:27 AM [facility]    "

## 2024-11-07 ENCOUNTER — Ambulatory Visit

## 2024-11-07 DIAGNOSIS — Z01818 Encounter for other preprocedural examination: Secondary | ICD-10-CM

## 2025-04-24 ENCOUNTER — Ambulatory Visit: Admitting: Diagnostic Neuroimaging
